# Patient Record
Sex: Female | Born: 1945 | Race: White | Hispanic: No | Smoking: Never smoker
Health system: Southern US, Community
[De-identification: ages and names within clinical notes are randomized; demographics above are authoritative.]

## PROBLEM LIST (undated history)

## (undated) DIAGNOSIS — I219 Acute myocardial infarction, unspecified: Secondary | ICD-10-CM

## (undated) DIAGNOSIS — E079 Disorder of thyroid, unspecified: Secondary | ICD-10-CM

## (undated) DIAGNOSIS — E119 Type 2 diabetes mellitus without complications: Secondary | ICD-10-CM

---

## 2018-01-30 ENCOUNTER — Other Ambulatory Visit (HOSPITAL_COMMUNITY): Payer: Self-pay | Admitting: Family Medicine

## 2018-01-30 DIAGNOSIS — Z1231 Encounter for screening mammogram for malignant neoplasm of breast: Secondary | ICD-10-CM

## 2018-04-07 ENCOUNTER — Other Ambulatory Visit: Payer: Self-pay

## 2018-04-07 ENCOUNTER — Inpatient Hospital Stay: Payer: Medicare Other

## 2018-04-07 ENCOUNTER — Encounter: Payer: Self-pay | Admitting: Emergency Medicine

## 2018-04-07 ENCOUNTER — Emergency Department: Payer: Medicare Other

## 2018-04-07 DIAGNOSIS — H9190 Unspecified hearing loss, unspecified ear: Secondary | ICD-10-CM | POA: Diagnosis present

## 2018-04-07 DIAGNOSIS — I5020 Unspecified systolic (congestive) heart failure: Secondary | ICD-10-CM | POA: Diagnosis present

## 2018-04-07 DIAGNOSIS — R531 Weakness: Secondary | ICD-10-CM

## 2018-04-07 DIAGNOSIS — I2510 Atherosclerotic heart disease of native coronary artery without angina pectoris: Secondary | ICD-10-CM | POA: Diagnosis present

## 2018-04-07 DIAGNOSIS — D65 Disseminated intravascular coagulation [defibrination syndrome]: Secondary | ICD-10-CM | POA: Diagnosis present

## 2018-04-07 DIAGNOSIS — Z7951 Long term (current) use of inhaled steroids: Secondary | ICD-10-CM

## 2018-04-07 DIAGNOSIS — Z8249 Family history of ischemic heart disease and other diseases of the circulatory system: Secondary | ICD-10-CM | POA: Diagnosis not present

## 2018-04-07 DIAGNOSIS — D649 Anemia, unspecified: Secondary | ICD-10-CM | POA: Diagnosis present

## 2018-04-07 DIAGNOSIS — E875 Hyperkalemia: Secondary | ICD-10-CM | POA: Diagnosis not present

## 2018-04-07 DIAGNOSIS — E1122 Type 2 diabetes mellitus with diabetic chronic kidney disease: Secondary | ICD-10-CM | POA: Diagnosis present

## 2018-04-07 DIAGNOSIS — E1165 Type 2 diabetes mellitus with hyperglycemia: Secondary | ICD-10-CM | POA: Diagnosis present

## 2018-04-07 DIAGNOSIS — I252 Old myocardial infarction: Secondary | ICD-10-CM

## 2018-04-07 DIAGNOSIS — Z20828 Contact with and (suspected) exposure to other viral communicable diseases: Secondary | ICD-10-CM

## 2018-04-07 DIAGNOSIS — R7989 Other specified abnormal findings of blood chemistry: Secondary | ICD-10-CM | POA: Diagnosis present

## 2018-04-07 DIAGNOSIS — I214 Non-ST elevation (NSTEMI) myocardial infarction: Principal | ICD-10-CM | POA: Diagnosis present

## 2018-04-07 DIAGNOSIS — E872 Acidosis: Secondary | ICD-10-CM | POA: Diagnosis present

## 2018-04-07 DIAGNOSIS — R011 Cardiac murmur, unspecified: Secondary | ICD-10-CM | POA: Diagnosis present

## 2018-04-07 DIAGNOSIS — D62 Acute posthemorrhagic anemia: Secondary | ICD-10-CM | POA: Diagnosis present

## 2018-04-07 DIAGNOSIS — I469 Cardiac arrest, cause unspecified: Secondary | ICD-10-CM | POA: Diagnosis not present

## 2018-04-07 DIAGNOSIS — I429 Cardiomyopathy, unspecified: Secondary | ICD-10-CM | POA: Diagnosis present

## 2018-04-07 DIAGNOSIS — J9601 Acute respiratory failure with hypoxia: Secondary | ICD-10-CM | POA: Diagnosis present

## 2018-04-07 DIAGNOSIS — G931 Anoxic brain damage, not elsewhere classified: Secondary | ICD-10-CM | POA: Diagnosis not present

## 2018-04-07 DIAGNOSIS — E876 Hypokalemia: Secondary | ICD-10-CM | POA: Diagnosis present

## 2018-04-07 DIAGNOSIS — N170 Acute kidney failure with tubular necrosis: Secondary | ICD-10-CM | POA: Diagnosis present

## 2018-04-07 DIAGNOSIS — E039 Hypothyroidism, unspecified: Secondary | ICD-10-CM | POA: Diagnosis present

## 2018-04-07 DIAGNOSIS — Z01818 Encounter for other preprocedural examination: Secondary | ICD-10-CM

## 2018-04-07 DIAGNOSIS — I959 Hypotension, unspecified: Secondary | ICD-10-CM | POA: Diagnosis not present

## 2018-04-07 DIAGNOSIS — N189 Chronic kidney disease, unspecified: Secondary | ICD-10-CM | POA: Diagnosis present

## 2018-04-07 DIAGNOSIS — Z91040 Latex allergy status: Secondary | ICD-10-CM

## 2018-04-07 DIAGNOSIS — R778 Other specified abnormalities of plasma proteins: Secondary | ICD-10-CM | POA: Diagnosis present

## 2018-04-07 DIAGNOSIS — M7989 Other specified soft tissue disorders: Secondary | ICD-10-CM

## 2018-04-07 HISTORY — DX: Disorder of thyroid, unspecified: E07.9

## 2018-04-07 HISTORY — DX: Acute myocardial infarction, unspecified: I21.9

## 2018-04-07 HISTORY — DX: Type 2 diabetes mellitus without complications: E11.9

## 2018-04-07 LAB — CBC WITH DIFFERENTIAL/PLATELET
Abs Immature Granulocytes: 1.68 10*3/uL — ABNORMAL HIGH (ref 0.00–0.07)
Basophils Absolute: 0.1 10*3/uL (ref 0.0–0.1)
Basophils Relative: 1 %
Eosinophils Absolute: 0.2 10*3/uL (ref 0.0–0.5)
Eosinophils Relative: 1 %
HCT: 27 % — ABNORMAL LOW (ref 36.0–46.0)
Hemoglobin: 8.2 g/dL — ABNORMAL LOW (ref 12.0–15.0)
Immature Granulocytes: 11 %
Lymphocytes Relative: 13 %
Lymphs Abs: 2.1 10*3/uL (ref 0.7–4.0)
MCH: 28.4 pg (ref 26.0–34.0)
MCHC: 30.4 g/dL (ref 30.0–36.0)
MCV: 93.4 fL (ref 80.0–100.0)
Monocytes Absolute: 0.8 10*3/uL (ref 0.1–1.0)
Monocytes Relative: 5 %
Neutro Abs: 10.9 10*3/uL — ABNORMAL HIGH (ref 1.7–7.7)
Neutrophils Relative %: 69 %
PLATELETS: 76 10*3/uL — AB (ref 150–400)
RBC: 2.89 MIL/uL — ABNORMAL LOW (ref 3.87–5.11)
RDW: 18.2 % — AB (ref 11.5–15.5)
WBC: 15.7 10*3/uL — ABNORMAL HIGH (ref 4.0–10.5)
nRBC: 1.6 % — ABNORMAL HIGH (ref 0.0–0.2)

## 2018-04-07 LAB — URINALYSIS, COMPLETE (UACMP) WITH MICROSCOPIC
BILIRUBIN URINE: NEGATIVE
Bacteria, UA: NONE SEEN
Glucose, UA: 50 mg/dL — AB
Hgb urine dipstick: NEGATIVE
Ketones, ur: NEGATIVE mg/dL
Leukocytes,Ua: NEGATIVE
Nitrite: NEGATIVE
Protein, ur: 30 mg/dL — AB
Specific Gravity, Urine: 1.023 (ref 1.005–1.030)
pH: 5 (ref 5.0–8.0)

## 2018-04-07 LAB — PROTIME-INR
INR: 1.3 — ABNORMAL HIGH (ref 0.8–1.2)
Prothrombin Time: 16.1 seconds — ABNORMAL HIGH (ref 11.4–15.2)

## 2018-04-07 LAB — HEPATIC FUNCTION PANEL
ALT: 13 U/L (ref 0–44)
AST: 27 U/L (ref 15–41)
Albumin: 2.1 g/dL — ABNORMAL LOW (ref 3.5–5.0)
Alkaline Phosphatase: 1000 U/L — ABNORMAL HIGH (ref 38–126)
BILIRUBIN DIRECT: 0.2 mg/dL (ref 0.0–0.2)
BILIRUBIN TOTAL: 0.6 mg/dL (ref 0.3–1.2)
Indirect Bilirubin: 0.4 mg/dL (ref 0.3–0.9)
Total Protein: 5.2 g/dL — ABNORMAL LOW (ref 6.5–8.1)

## 2018-04-07 LAB — CBC
HCT: 26.8 % — ABNORMAL LOW (ref 36.0–46.0)
Hemoglobin: 8.4 g/dL — ABNORMAL LOW (ref 12.0–15.0)
MCH: 27.5 pg (ref 26.0–34.0)
MCHC: 31.3 g/dL (ref 30.0–36.0)
MCV: 87.9 fL (ref 80.0–100.0)
PLATELETS: 85 10*3/uL — AB (ref 150–400)
RBC: 3.05 MIL/uL — ABNORMAL LOW (ref 3.87–5.11)
RDW: 17.8 % — ABNORMAL HIGH (ref 11.5–15.5)
WBC: 15.4 10*3/uL — ABNORMAL HIGH (ref 4.0–10.5)
nRBC: 1.5 % — ABNORMAL HIGH (ref 0.0–0.2)

## 2018-04-07 LAB — BASIC METABOLIC PANEL
Anion gap: 15 (ref 5–15)
BUN: 65 mg/dL — ABNORMAL HIGH (ref 8–23)
CALCIUM: 7.7 mg/dL — AB (ref 8.9–10.3)
CO2: 15 mmol/L — ABNORMAL LOW (ref 22–32)
Chloride: 105 mmol/L (ref 98–111)
Creatinine, Ser: 1.26 mg/dL — ABNORMAL HIGH (ref 0.44–1.00)
GFR calc Af Amer: 49 mL/min — ABNORMAL LOW (ref >60)
GFR, EST NON AFRICAN AMERICAN: 42 mL/min — AB (ref >60)
Glucose, Bld: 372 mg/dL — ABNORMAL HIGH (ref 70–99)
Potassium: 2.2 mmol/L — CL (ref 3.5–5.1)
Sodium: 135 mmol/L (ref 135–145)

## 2018-04-07 LAB — GLUCOSE, CAPILLARY: Glucose-Capillary: 372 mg/dL — ABNORMAL HIGH (ref 70–99)

## 2018-04-07 LAB — MAGNESIUM: Magnesium: 2.4 mg/dL (ref 1.7–2.4)

## 2018-04-07 LAB — TROPONIN I
Troponin I: 3.26 ng/mL (ref <0.03)
Troponin I: 2.91 ng/mL (ref <0.03)

## 2018-04-07 LAB — LACTIC ACID, PLASMA
LACTIC ACID, VENOUS: 2 mmol/L — AB (ref 0.5–1.9)
Lactic Acid, Venous: 2.9 mmol/L (ref 0.5–1.9)
Lactic Acid, Venous: 2.6 mmol/L (ref 0.5–1.9)

## 2018-04-07 LAB — HEPARIN LEVEL (UNFRACTIONATED): Heparin Unfractionated: <0.1 IU/mL — ABNORMAL LOW (ref 0.30–0.70)

## 2018-04-07 LAB — APTT: aPTT: 33 seconds (ref 24–36)

## 2018-04-07 MED ORDER — INSULIN ASPART 100 UNIT/ML ~~LOC~~ SOLN
0.0000 [IU] | Freq: Every day | SUBCUTANEOUS | Status: DC
  Administered 2018-04-07: 5 [IU] via SUBCUTANEOUS
  Filled 2018-04-07: qty 1

## 2018-04-07 MED ORDER — INSULIN ASPART 100 UNIT/ML ~~LOC~~ SOLN
0.0000 [IU] | Freq: Three times a day (TID) | SUBCUTANEOUS | Status: DC
  Administered 2018-04-08: 5 [IU] via SUBCUTANEOUS
  Administered 2018-04-08: 3 [IU] via SUBCUTANEOUS
  Filled 2018-04-07 (×2): qty 1

## 2018-04-07 MED ORDER — POTASSIUM CHLORIDE 10 MEQ/100ML IV SOLN
10.0000 meq | Freq: Once | INTRAVENOUS | Status: AC
  Administered 2018-04-07: 10 meq via INTRAVENOUS
  Filled 2018-04-07 (×2): qty 100

## 2018-04-07 MED ORDER — ONDANSETRON HCL 4 MG/2ML IJ SOLN: 4.0000 mg | Freq: Four times a day (QID) | INTRAMUSCULAR | Status: DC | PRN

## 2018-04-07 MED ORDER — CARVEDILOL 3.125 MG PO TABS
3.1250 mg | ORAL_TABLET | Freq: Two times a day (BID) | ORAL | Status: DC
  Administered 2018-04-07 – 2018-04-08 (×2): 3.125 mg via ORAL
  Filled 2018-04-07 (×2): qty 1

## 2018-04-07 MED ORDER — POTASSIUM CHLORIDE 10 MEQ/100ML IV SOLN
10.0000 meq | Freq: Once | INTRAVENOUS | Status: AC
  Administered 2018-04-07: 10 meq via INTRAVENOUS
  Filled 2018-04-07: qty 100

## 2018-04-07 MED ORDER — SODIUM CHLORIDE 0.9% FLUSH: 3.0000 mL | Freq: Once | INTRAVENOUS | Status: DC

## 2018-04-07 MED ORDER — POTASSIUM CHLORIDE CRYS ER 20 MEQ PO TBCR
40.0000 meq | EXTENDED_RELEASE_TABLET | Freq: Three times a day (TID) | ORAL | Status: AC
  Administered 2018-04-07 – 2018-04-08 (×3): 40 meq via ORAL
  Filled 2018-04-07 (×3): qty 2

## 2018-04-07 MED ORDER — SODIUM CHLORIDE 0.9 % IV BOLUS
1000.0000 mL | Freq: Once | INTRAVENOUS | Status: AC
  Administered 2018-04-07: 1000 mL via INTRAVENOUS

## 2018-04-07 MED ORDER — SODIUM CHLORIDE 0.9 % IV SOLN
INTRAVENOUS | Status: DC
  Administered 2018-04-07: 22:00:00 via INTRAVENOUS

## 2018-04-07 MED ORDER — ACETAMINOPHEN 325 MG PO TABS
650.0000 mg | ORAL_TABLET | Freq: Four times a day (QID) | ORAL | Status: DC | PRN
  Administered 2018-04-07: 650 mg via ORAL
  Filled 2018-04-07: qty 2

## 2018-04-07 MED ORDER — ASPIRIN EC 81 MG PO TBEC
81.0000 mg | DELAYED_RELEASE_TABLET | Freq: Every day | ORAL | Status: DC
  Filled 2018-04-07: qty 1

## 2018-04-07 MED ORDER — ACETAMINOPHEN 650 MG RE SUPP: 650.0000 mg | Freq: Four times a day (QID) | RECTAL | Status: DC | PRN

## 2018-04-07 MED ORDER — ONDANSETRON HCL 4 MG PO TABS: 4.0000 mg | ORAL_TABLET | Freq: Four times a day (QID) | ORAL | Status: DC | PRN

## 2018-04-07 MED ORDER — IOHEXOL 350 MG/ML SOLN
60.0000 mL | Freq: Once | INTRAVENOUS | Status: AC | PRN
  Administered 2018-04-07: 60 mL via INTRAVENOUS

## 2018-04-07 MED ORDER — POLYETHYLENE GLYCOL 3350 17 G PO PACK: 17.0000 g | PACK | Freq: Every day | ORAL | Status: DC | PRN

## 2018-04-07 MED ORDER — HEPARIN (PORCINE) 25000 UT/250ML-% IV SOLN
900.0000 [IU]/h | INTRAVENOUS | Status: DC
  Administered 2018-04-07: 700 [IU]/h via INTRAVENOUS
  Filled 2018-04-07: qty 250

## 2018-04-07 MED ORDER — ATORVASTATIN CALCIUM 20 MG PO TABS
40.0000 mg | ORAL_TABLET | Freq: Every day | ORAL | Status: DC
  Administered 2018-04-07: 40 mg via ORAL
  Filled 2018-04-07: qty 2

## 2018-04-07 NOTE — ED Notes (Signed)
patient c/o of nausea.  Admit orders released and PRN zofran administered. Vss. Heparin infusing Troponin now order drawn and sent.

## 2018-04-07 NOTE — Consult Note (Addendum)
ANTICOAGULATION CONSULT NOTE - Initial Consult  Pharmacy Consult for heparin  Indication: chest pain/ACS  Allergies  Allergen Reactions  . Latex Rash    Patient Measurements: Height: 5\' 3"  (160 cm) Weight: 130 lb (59 kg) IBW/kg (Calculated) : 52.4 Heparin Dosing Weight: 59 kg   Vital Signs: Temp: 98.6 F (37 C) (03/30 1522) Temp Source: Oral (03/30 1522) BP: 124/70 (03/30 1522) Pulse Rate: 94 (03/30 1522)  Labs: Recent Labs    03/31/2018 1157 04/05/2018 1417  HGB 8.4*  --   HCT 26.8*  --   PLT 85*  --   CREATININE 1.26*  --   TROPONINI  --  3.26*     Estimated Creatinine Clearance: 32.9 mL/min (A) (by C-G formula based on SCr of 1.26 mg/dL (H)).   Medical History: Past Medical History:  Diagnosis Date  . Diabetes mellitus without complication (HCC)   . MI (myocardial infarction) (HCC)   . Thyroid disease     Medications:  (Not in a hospital admission)  Scheduled:  . sodium chloride flush  3 mL Intravenous Once   Infusions:  . potassium chloride 10 mEq (03/31/2018 1525)   PRN:   Assessment: Pharmacy consulted for ACS; the tropinin level is elevated. However, her platelet count is indicative of thrombocytopenia. Therefore, a bolus dose was not given. Will need to assess platelet count moving forward.. Patient does not have a h/o anticoagulation per chart review. The medication history was not completed at the time, but home medication was confirmed with her son "Tasia Catchings" Chrissie Noa). He noted that he is not aware of his mother taking "blood thinners" but noted she does take a "baby aspirin" each day.   Goal of Therapy:  Heparin level 0.3-0.7 units/ml Monitor platelets by anticoagulation protocol: Yes   Plan:  Start heparin infusion at 700 units/hr Check anti-Xa level in 8 hours and daily while on heparin  Katha Cabal, PharmD 03/24/2018,3:27 PM

## 2018-04-07 NOTE — ED Notes (Signed)
Heparin drip started. PT / INR drawn and sent

## 2018-04-07 NOTE — Progress Notes (Signed)
Family Meeting Note  Advance Directive:no  Today a meeting took place with the Patient.  Patient is able to participate.  The following clinical team members were present during this meeting:MD  The following were discussed:Patient's diagnosis: elevated troponin, Patient's progosis: Unable to determine and Goals for treatment: Full Code  Additional follow-up to be provided: prn  Time spent during discussion:20 minutes  Hilton Sinclair, MD

## 2018-04-07 NOTE — ED Triage Notes (Signed)
Son says patient has had h3ealth problems for about 4 years and has seen various doctors. The last ging in [place].  Says for 3 weeks has weakness, pain, can barely move and is not eating or drinking enough.  Patient is awake now in wheelchair.

## 2018-04-07 NOTE — ED Notes (Signed)
ED TO INPATIENT HANDOFF REPORT  ED Nurse Name and Phone #:   S Name/Age/Gender Stacy Duran 73 y.o. female Room/Bed: ED08A/ED08A  Code Status   Code Status: Full Code  Home/SNF/Other Home Patient oriented to: self Is this baseline? Yes  yes Triage Complete: Triage complete  Chief Complaint multiple medical complaints  Triage Note Son says patient has had h3ealth problems for about 4 years and has seen various doctors. The last ging in [place].  Says for 3 weeks has weakness, pain, can barely move and is not eating or drinking enough.  Patient is awake now in wheelchair.     Allergies Allergies  Allergen Reactions  . Latex Rash    Level of Care/Admitting Diagnosis ED Disposition    ED Disposition Condition Comment   Admit  Hospital Area: [facility] [[id number]]  Level of Care: Telemetry [5]  Diagnosis: Elevated troponin [321909]  Admitting Physician: Willadean Carol DODD [[id number]]  Attending Physician: Willadean Carol DODD [[id number]]  Estimated length of stay: past midnight tomorrow  Certification:: I certify this patient will need inpatient services for at least 2 midnights  PT Class (Do Not Modify): Inpatient [101]  PT Acc Code (Do Not Modify): Private [1]       B Medical/Surgery History Past Medical History:  Diagnosis Date  . Diabetes mellitus without complication (HCC)   . MI (myocardial infarction) (HCC)   . Thyroid disease    History reviewed. No pertinent surgical history.   A IV Location/Drains/Wounds Patient Lines/Drains/Airways Status   Active Line/Drains/Airways    Name:   Placement date:   Placement time:   Site:   Days:   Peripheral IV 03/19/2018 Right Antecubital   04/03/2018    1307    Antecubital   less than 1   Peripheral IV 03/11/2018 Right Wrist   04/01/2018    1538    Wrist   less than 1          Intake/Output Last 24 hours  Intake/Output Summary (Last 24 hours) at 03/11/2018 1814 Last data filed at 03/20/2018 1704 Gross  per 24 hour  Intake 1200 ml  Output -  Net 1200 ml    Labs/Imaging Results for orders placed or performed during the hospital encounter of 04/06/2018 (from the past 48 hour(s))  Basic metabolic panel     Status: Abnormal   Collection Time: 03/09/2018 11:57 AM  Result Value Ref Range   Sodium 135 135 - 145 mmol/L   Potassium 2.2 (LL) 3.5 - 5.1 mmol/L    Comment: CRITICAL RESULT CALLED TO, READ BACK BY AND VERIFIED WITH LINDA MCLAMB @1229  03/15/2018 MU/DAS    Chloride 105 98 - 111 mmol/L   CO2 15 (L) 22 - 32 mmol/L   Glucose, Bld 372 (H) 70 - 99 mg/dL   BUN 65 (H) 8 - 23 mg/dL   Creatinine, Ser 5.83 (H) 0.44 - 1.00 mg/dL   Calcium 7.7 (L) 8.9 - 10.3 mg/dL   GFR calc non Af Amer 42 (L) >60 mL/min   GFR calc Af Amer 49 (L) >60 mL/min   Anion gap 15 5 - 15    Comment: Performed at [facility], [address] [place], [place] [address]  CBC     Status: Abnormal   Collection Time: 03/12/2018 11:57 AM  Result Value Ref Range   WBC 15.4 (H) 4.0 - 10.5 K/uL   RBC 3.05 (L) 3.87 - 5.11 MIL/uL   Hemoglobin 8.4 (L) 12.0 - 15.0 g/dL  HCT 26.8 (L) 36.0 - 46.0 %   MCV 87.9 80.0 - 100.0 fL   MCH 27.5 26.0 - 34.0 pg   MCHC 31.3 30.0 - 36.0 g/dL   RDW 29.2 (H) 44.6 - 28.6 %   Platelets 85 (L) 150 - 400 K/uL    Comment: Immature Platelet Fraction may be clinically indicated, consider ordering this additional test [id number]    nRBC 1.5 (H) 0.0 - 0.2 %    Comment: Performed at [facility], [address] [place], [place] [address]  Urinalysis, Complete w Microscopic     Status: Abnormal   Collection Time: 03/18/2018  1:02 PM  Result Value Ref Range   Color, Urine YELLOW (A) YELLOW   APPearance HAZY (A) CLEAR   Specific Gravity, Urine 1.023 1.005 - 1.030   pH 5.0 5.0 - 8.0   Glucose, UA 50 (A) NEGATIVE mg/dL   Hgb urine dipstick NEGATIVE NEGATIVE   Bilirubin Urine NEGATIVE NEGATIVE   Ketones, ur NEGATIVE NEGATIVE mg/dL   Protein, ur 30 (A) NEGATIVE mg/dL   Nitrite  NEGATIVE NEGATIVE   Leukocytes,Ua NEGATIVE NEGATIVE   RBC / HPF 0-5 0 - 5 RBC/hpf   WBC, UA 6-10 0 - 5 WBC/hpf   Bacteria, UA NONE SEEN NONE SEEN   Squamous Epithelial / LPF 0-5 0 - 5   Mucus PRESENT    Hyaline Casts, UA PRESENT     Comment: Performed at [facility], [address] [place], [place] [address]  Troponin I - Once     Status: Abnormal   Collection Time: 04/06/2018  2:17 PM  Result Value Ref Range   Troponin I 3.26 (HH) <0.03 ng/mL    Comment: CRITICAL RESULT CALLED TO, READ BACK BY AND VERIFIED WITH KIM GAULT AT 1450 04/02/2018 DAS Performed at [facility], [address] [place], [place] [address]   Lactic acid, plasma     Status: Abnormal   Collection Time: 04/04/2018  2:17 PM  Result Value Ref Range   Lactic Acid, Venous 2.0 (HH) 0.5 - 1.9 mmol/L    Comment: CRITICAL RESULT CALLED TO, READ BACK BY AND VERIFIED WITH Bueford Arp PEREZ AT 1533 03/22/2018 DAS Performed at [facility], [address], [place], [place] [address]   Magnesium     Status: None   Collection Time: 03/12/2018  2:17 PM  Result Value Ref Range   Magnesium 2.4 1.7 - 2.4 mg/dL    Comment: Performed at [facility], [address] [place], [place] [address]  Lactic acid, plasma     Status: Abnormal   Collection Time: 03/31/2018  3:41 PM  Result Value Ref Range   Lactic Acid, Venous 2.9 (HH) 0.5 - 1.9 mmol/L    Comment: CRITICAL RESULT CALLED TO, READ BACK BY AND VERIFIED WITH JANETTE PEREZ AT 1612 04/02/2018.  TFK Performed at [facility], [address] [place], [place] [address]   Protime-INR     Status: Abnormal   Collection Time: 03/09/2018  4:07 PM  Result Value Ref Range   Prothrombin Time 16.1 (H) 11.4 - 15.2 seconds   INR 1.3 (H) 0.8 - 1.2    Comment: (NOTE) INR goal varies based on device and disease states. Performed at [facility], [address] [place], [place] [address]   APTT     Status: None   Collection Time:  03/21/2018  4:07 PM  Result Value Ref Range   aPTT 33 24 - 36 seconds  Comment: Performed at [facility], [address] [place], [place] [address]   Dg Chest Portable 1 View  Result Date: 03/23/2018 CLINICAL DATA:  Three-week history weakness.  Hypoxia. EXAM: PORTABLE CHEST 1 VIEW COMPARISON:  None. FINDINGS: The heart is enlarged. There is no edema or effusion. The visualized soft tissues and bony thorax are unremarkable. IMPRESSION: Cardiomegaly without failure. Electronically Signed   By: Marin Robertshristopher  Mattern M.D.   On: 03/25/2018 14:37    Pending Labs Unresulted Labs (From admission, onward)    Start     Ordered   05/20/18 0500  CBC  Tomorrow morning,   STAT     03/23/2018 1551   05/20/18 0500  Basic metabolic panel  Tomorrow morning,   STAT     03/14/2018 1742   05/20/18 0500  Ferritin  Tomorrow morning,   STAT     03/28/2018 1742   05/20/18 0500  Iron and TIBC  Tomorrow morning,   STAT     04/04/2018 1742   05/20/18 0500  Reticulocytes  Tomorrow morning,   STAT     03/28/2018 1742   05/20/18 0500  Vitamin B12  Tomorrow morning,   STAT     03/15/2018 1742   05/20/18 0500  Folate  Tomorrow morning,   STAT     03/20/2018 1742   05/20/18 0500  Hemoglobin A1c  Tomorrow morning,   STAT     03/13/2018 1742   05/20/18 0500  Lipid panel  Tomorrow morning,   STAT     03/17/2018 1742   03/20/2018 2200  Heparin level (unfractionated)  Once-Timed,   STAT     03/27/2018 1551   03/25/2018 1841  Lactic acid, plasma  STAT Now then every 3 hours,   STAT     03/20/2018 1742   03/19/2018 1742  Troponin I - Now Then Q6H  Now then every 6 hours,   STAT     04/06/2018 1742   03/27/2018 1742  Respiratory Panel by PCR  (Respiratory virus panel with precautions)  Once,   STAT     03/29/2018 1742   03/28/2018 1742  Occult blood card to lab, stool RN will collect  Once,   STAT    Question:  Specimen to be collected by?  Answer:  RN will collect   03/23/2018 1742   03/26/2018 1742  Differential  Add-on,   AD     03/24/2018  1742   03/23/2018 1742  Hepatic function panel  Add-on,   AD     03/09/2018 1742          Vitals/Pain Today's Vitals   03/16/2018 1630 03/31/2018 1645 03/12/2018 1700 03/29/2018 1705  BP: 108/76  118/74   Pulse: 91 87 86   Resp: (!) 23 (!) 24 (!) 25   Temp:      TempSrc:      SpO2: 91% 91% 92%   Weight:      Height:      PainSc:    0-No pain    Isolation Precautions Droplet precaution  Medications Medications  sodium chloride flush (NS) 0.9 % injection 3 mL (3 mLs Intravenous Not Given 03/10/2018 1432)  heparin ADULT infusion 100 units/mL (25000 units/27450mL sodium chloride 0.45%) (700 Units/hr Intravenous New Bag/Given 03/15/2018 1703)  0.9 %  sodium chloride infusion (has no administration in time range)  acetaminophen (TYLENOL) tablet 650 mg (has no administration in time range)    Or  acetaminophen (TYLENOL) suppository 650 mg (has no administration  in time range)  polyethylene glycol (MIRALAX / GLYCOLAX) packet 17 g (has no administration in time range)  ondansetron (ZOFRAN) tablet 4 mg (has no administration in time range)    Or  ondansetron (ZOFRAN) injection 4 mg (has no administration in time range)  aspirin EC tablet 81 mg (has no administration in time range)  potassium chloride SA (K-DUR,KLOR-CON) CR tablet 40 mEq (has no administration in time range)  insulin aspart (novoLOG) injection 0-9 Units (has no administration in time range)  insulin aspart (novoLOG) injection 0-5 Units (has no administration in time range)  carvedilol (COREG) tablet 3.125 mg (has no administration in time range)  atorvastatin (LIPITOR) tablet 40 mg (has no administration in time range)  sodium chloride 0.9 % bolus 1,000 mL (0 mLs Intravenous Stopped 01-May-2018 1704)  potassium chloride 10 mEq in 100 mL IVPB (0 mEq Intravenous Stopped 05/01/2018 1524)  potassium chloride 10 mEq in 100 mL IVPB (0 mEq Intravenous Stopped 01-May-2018 1658)  iohexol (OMNIPAQUE) 350 MG/ML injection 60 mL (60 mLs Intravenous Contrast  Given 01-May-2018 1755)    Mobility walks with device High fall risk   Focused Assessments Cardiac Assessment Handoff:    Lab Results  Component Value Date   TROPONINI 3.26 (HH) 01-May-2018   No results found for: DDIMER Does the Patient currently have chest pain? No     R Recommendations: See Admitting Provider Note  Report given to:   Additional Notes:

## 2018-04-07 NOTE — ED Provider Notes (Signed)
[facility] Emergency Department Provider Note ____________________________________________   First MD Initiated Contact with Patient 2018/05/03 1330     (approximate)  I have reviewed the triage vital signs and the nursing notes.   HISTORY  Chief Complaint Weakness and Anorexia    HPI Stacy Duran is a 73 y.o. female with PMH as noted below who presents with generalized weakness, gradual onset over the last several weeks, acutely worse in the last 1 to 2 days, and associate with decreased appetite.  She denies fever, vomiting or diarrhea, or specific pain although she does have some chest discomfort.  She denies any sick contacts or recent illness.  Past Medical History:  Diagnosis Date  . Diabetes mellitus without complication (HCC)   . MI (myocardial infarction) (HCC)   . Thyroid disease     There are no active problems to display for this patient.   History reviewed. No pertinent surgical history.  Prior to Admission medications   Medication Sig Start Date End Date Taking? Authorizing Provider  fluticasone (FLONASE) 50 MCG/ACT nasal spray Place 1 spray into both nostrils 2 (two) times daily. 03/11/18  Yes [provider]    Allergies Latex  No family history on file.  Social History Social History   Tobacco Use  . Smoking status: Never Smoker  . Smokeless tobacco: Never Used  Substance Use Topics  . Alcohol use: Never    Frequency: Never  . Drug use: Not on file    Review of Systems  Constitutional: No fever.  Positive for generalized weakness. Eyes: No redness. ENT: No sore throat. Cardiovascular: Denies chest pain. Respiratory: Positive for mild shortness of breath. Gastrointestinal: No vomiting or diarrhea.  Genitourinary: Negative for dysuria.  Musculoskeletal: Negative for back pain. Skin: Negative for rash. Neurological: Negative for headaches, focal weakness or numbness.    ____________________________________________   PHYSICAL EXAM:  VITAL SIGNS: ED Triage Vitals  Enc Vitals Group     BP 2018/05/03 1150 (!) 94/52     Pulse Rate 03-May-2018 1150 86     Resp 05-03-2018 1150 20     Temp 05/03/2018 1150 97.6 F (36.4 C)     Temp Source 03-May-2018 1150 Axillary     SpO2 05/03/18 1150 94 %     Weight 03-May-2018 1151 130 lb (59 kg)     Height 05/03/2018 1151 5\' 3"  (1.6 m)     Head Circumference --      Peak Flow --      Pain Score 05/03/18 1151 3     Pain Loc --      Pain Edu? --      Excl. in GC? --     Constitutional: Alert and oriented.  Weak and frail appearing but in no acute distress. Eyes: Conjunctivae are normal.  EOMI.  PERRLA. Head: Atraumatic. Nose: No congestion/rhinnorhea. Mouth/Throat: Mucous membranes are dry.   Neck: Normal range of motion.  Cardiovascular: Normal rate, regular rhythm. Grossly normal heart sounds.  Good peripheral circulation. Respiratory: Normal respiratory effort.  No retractions. Lungs CTAB. Gastrointestinal: Soft and nontender. No distention.  Genitourinary: No flank tenderness. Musculoskeletal: No lower extremity edema.  Extremities warm and well perfused.  Neurologic:  Normal speech and language.  5/5 motor strength and intact sensation to all extremities.  No gross focal neurologic deficits are appreciated.  Skin:  Skin is warm and dry. No rash noted. Psychiatric: Mood and affect are normal. Speech and behavior are normal.  ____________________________________________   LABS (all  labs ordered are listed, but only abnormal results are displayed)  Labs Reviewed  BASIC METABOLIC PANEL - Abnormal; Notable for the following components:      Result Value   Potassium 2.2 (*)    CO2 15 (*)    Glucose, Bld 372 (*)    BUN 65 (*)    Creatinine, Ser 1.26 (*)    Calcium 7.7 (*)    GFR calc non Af Amer 42 (*)    GFR calc Af Amer 49 (*)    All other components within normal limits  CBC - Abnormal; Notable for the following  components:   WBC 15.4 (*)    RBC 3.05 (*)    Hemoglobin 8.4 (*)    HCT 26.8 (*)    RDW 17.8 (*)    Platelets 85 (*)    nRBC 1.5 (*)    All other components within normal limits  URINALYSIS, COMPLETE (UACMP) WITH MICROSCOPIC - Abnormal; Notable for the following components:   Color, Urine YELLOW (*)    APPearance HAZY (*)    Glucose, UA 50 (*)    Protein, ur 30 (*)    All other components within normal limits  TROPONIN I - Abnormal; Notable for the following components:   Troponin I 3.26 (*)    All other components within normal limits  LACTIC ACID, PLASMA - Abnormal; Notable for the following components:   Lactic Acid, Venous 2.0 (*)    All other components within normal limits  MAGNESIUM  LACTIC ACID, PLASMA  PROTIME-INR  APTT  HEPARIN LEVEL (UNFRACTIONATED)   ____________________________________________  EKG  ED ECG REPORT I, Dionne Bucy, the attending physician, personally viewed and interpreted this ECG.  Date: 04/25/18 EKG Time: 1153 Rate: 90 Rhythm: normal sinus rhythm QRS Axis: Left axis  Intervals: normal ST/T Wave abnormalities: normal Narrative Interpretation: no evidence of acute ischemia ____________________________________________  RADIOLOGY  CXR: Cardiomegaly with no focal infiltrate ____________________________________________   PROCEDURES  Procedure(s) performed: No  Procedures  Critical Care performed: Yes  CRITICAL CARE Performed by: Dionne Bucy   Total critical care time: 30 minutes  Critical care time was exclusive of separately billable procedures and treating other patients.  Critical care was necessary to treat or prevent imminent or life-threatening deterioration.  Critical care was time spent personally by me on the following activities: development of treatment plan with patient and/or surrogate as well as nursing, discussions with consultants, evaluation of patient's response to treatment, examination of  patient, obtaining history from patient or surrogate, ordering and performing treatments and interventions, ordering and review of laboratory studies, ordering and review of radiographic studies, pulse oximetry and re-evaluation of patient's condition. ____________________________________________   INITIAL IMPRESSION / ASSESSMENT AND PLAN / ED COURSE  Pertinent labs & imaging results that were available during my care of the patient were reviewed by me and considered in my medical decision making (see chart for details).  73 year old female with PMH as noted above presents with generalized weakness over the last few weeks, acutely worsened in the last few days, and associated with decreased appetite.  On exam the patient has a slightly low blood pressure.  Her O2 saturation was 89 to 90% on room air when I first entered the room but went up to the mid 90s when the patient started speaking.  Her other vital signs are normal.  She appears very weak and frail.  Neuro exam is nonfocal.  She has dry mucous membranes.  The remainder of the exam is  as described above.  Initial labs obtained from triage reveal hypokalemia as well as elevated WBC count.  Differential includes dehydration/hypovolemia, symptoms related to hypokalemia or other electrolyte abnormalities, infectious cause, or cardiac.  We will obtain chest x-ray, UA, add on a troponin and lactic acid, give a fluid bolus and potassium and reassess.  Given the patient's episodes of hypoxia and the low potassium as well as her very weak and debilitated state, she likely will require admission.  ----------------------------------------- 4:04 PM on 03/09/2018 -----------------------------------------  The work-up reveals elevated troponin although the patient has no EKG changes or significant ischemic findings.  I started her on a heparin drip, with no bolus because of her low platelets.  The patient will need to be admitted.  I signed her out to  the hospitalist Dr. Nancy MarusMayo.  ____________________________________________   FINAL CLINICAL IMPRESSION(S) / ED DIAGNOSES  Final diagnoses:  Non-ST elevated myocardial infarction (HCC)  Weakness  Hypokalemia      NEW MEDICATIONS STARTED DURING THIS VISIT:  New Prescriptions   No medications on file     Note:  This document was prepared using Dragon voice recognition software and may include unintentional dictation errors.   Dionne BucySiadecki, Brittnae Aschenbrenner, MD 04/02/2018 [phone]

## 2018-04-07 NOTE — ED Notes (Signed)
Flu swab sent. W/C with patient clothing sent up with ED tech to room 256.

## 2018-04-07 NOTE — ED Notes (Signed)
Patient off unit yo CT

## 2018-04-07 NOTE — ED Notes (Signed)
Assumed care of patient alert and awake soft spoken and HOH. Patient appears pale with pale conjunctivas and pale lips. Patient tachypnic, but denies sob, sating 94-95% on ra. Will monitor.

## 2018-04-07 NOTE — ED Notes (Signed)
Second IV placed to right wrist. Awaiting heparin wt based from pharmacy. Vss. Denies pain or discomforts. Bed status pending. Scheduled lactate drawn ans sent.

## 2018-04-07 NOTE — H&P (Addendum)
[employer] - [place] at [facility]   PATIENT NAME: Stacy Duran    MR#:  [id number]  DATE OF BIRTH:  09-Nov-1945  DATE OF ADMISSION:  04/02/2018  PRIMARY CARE PHYSICIAN: No primary care provider on file.   REQUESTING/REFERRING PHYSICIAN: Dionne Bucy, MD  CHIEF COMPLAINT:   Chief Complaint  Patient presents with  . Weakness  . Anorexia    HISTORY OF PRESENT ILLNESS:  Stacy Duran  is a 73 y.o. female with a known history of CAD s/p MI, type 2 diabetes, hypothyroidism who presented to the ED with generalized weakness over the last couple of days.  She also notes decreased appetite and poor p.o. intake.  The weakness is mostly located in her lower extremities.  She also endorses some body aches.  She states she has been spending more time in bed due to the weakness.  She denies any chest pain or palpitations.  She endorses some mild shortness of breath that is worse with exertion.  In the ED, she was mildly tachypneic.  Labs are significant for potassium 2.2, creatinine 1.26, WBC 15.4, hemoglobin 8.4, lactic acid 2.0, troponin 3.26.  UA was unremarkable.  Chest x-ray showed cardiomegaly without any acute pulmonary abnormalities.  She was started on a heparin drip.  Hospitalists were called for admission.  PAST MEDICAL HISTORY:   Past Medical History:  Diagnosis Date  . Diabetes mellitus without complication (HCC)   . MI (myocardial infarction) (HCC)   . Thyroid disease     PAST SURGICAL HISTORY:  None  SOCIAL HISTORY:   Social History   Tobacco Use  . Smoking status: Never Smoker  . Smokeless tobacco: Never Used  Substance Use Topics  . Alcohol use: Never    Frequency: Never    FAMILY HISTORY:  Father-heart attack  DRUG ALLERGIES:   Allergies  Allergen Reactions  . Latex Rash    REVIEW OF SYSTEMS:   Review of Systems  Constitutional: Positive for malaise/fatigue. Negative for chills and fever.  HENT: Negative for congestion  and sore throat.   Eyes: Negative for blurred vision and double vision.  Respiratory: Positive for shortness of breath. Negative for cough.   Cardiovascular: Positive for leg swelling. Negative for chest pain and palpitations.  Gastrointestinal: Negative for nausea and vomiting.  Genitourinary: Negative for dysuria and urgency.  Musculoskeletal: Positive for myalgias. Negative for back pain and neck pain.  Neurological: Positive for weakness. Negative for dizziness, sensory change, speech change, focal weakness and headaches.  Psychiatric/Behavioral: Negative for depression. The patient is not nervous/anxious.     MEDICATIONS AT HOME:   Prior to Admission medications   Medication Sig Start Date End Date Taking? Authorizing Provider  fluticasone (FLONASE) 50 MCG/ACT nasal spray Place 1 spray into both nostrils 2 (two) times daily. 03/11/18  Yes [provider]      VITAL SIGNS:  Blood pressure 124/70, pulse 94, temperature 98.6 F (37 C), temperature source Oral, resp. rate (!) 22, height 5\' 3"  (1.6 m), weight 59 kg, SpO2 95 %.  PHYSICAL EXAMINATION:  Physical Exam  GENERAL:  73 y.o.-year-old patient lying in the bed with no acute distress.  EYES: Pupils equal, round, reactive to light and accommodation. No scleral icterus. Extraocular muscles intact.  HEENT: Head atraumatic, normocephalic. Oropharynx and nasopharynx clear.  NECK:  Supple, no jugular venous distention. No thyroid enlargement, no tenderness.  LUNGS: Normal breath sounds bilaterally, no wheezing, rales,rhonchi or crepitation. + Mildly increased work of breathing. CARDIOVASCULAR: RRR, S1,  S2 normal. No murmurs, rubs, or gallops.  ABDOMEN: Soft, nontender, nondistended. Bowel sounds present. No organomegaly or mass.  EXTREMITIES: No pedal edema, cyanosis, or clubbing.  NEUROLOGIC: Cranial nerves II through XII are intact. + Global weakness. Sensation intact. Gait not checked.  PSYCHIATRIC: The patient is alert  and oriented x 3.  SKIN: No obvious rash, lesion, or ulcer. + Pale-appearing  LABORATORY PANEL:   CBC Recent Labs  Lab May 07, 2018 1157  WBC 15.4*  HGB 8.4*  HCT 26.8*  PLT 85*   ------------------------------------------------------------------------------------------------------------------  Chemistries  Recent Labs  Lab May 07, 2018 1157 May 07, 2018 1417  NA 135  --   K 2.2*  --   CL 105  --   CO2 15*  --   GLUCOSE 372*  --   BUN 65*  --   CREATININE 1.26*  --   CALCIUM 7.7*  --   MG  --  2.4   ------------------------------------------------------------------------------------------------------------------  Cardiac Enzymes Recent Labs  Lab 2018/05/07 1417  TROPONINI 3.26*   ------------------------------------------------------------------------------------------------------------------  RADIOLOGY:  Dg Chest Portable 1 View  Result Date: 05/07/2018 CLINICAL DATA:  Three-week history weakness.  Hypoxia. EXAM: PORTABLE CHEST 1 VIEW COMPARISON:  None. FINDINGS: The heart is enlarged. There is no edema or effusion. The visualized soft tissues and bony thorax are unremarkable. IMPRESSION: Cardiomegaly without failure. Electronically Signed   By: Marin Roberts M.D.   On: 05-07-2018 14:37      IMPRESSION AND PLAN:   Elevated troponin- due to NSTEMI vs demand ischemia. Troponin 3.26.  EKG without any signs of ischemia.  Patient does have a history of an MI.  She denies any active chest pain. -Continue heparin drip -[facility] consult -Trend troponins -Check ECHO -Start aspirin, beta blocker, lipitor -Check hemoglobin A1c and lipid panel -Cardiac monitoring  Shortness of breath- unclear etiology. O2 sats in the low 90s. No history of lung disease. CXR negative. Concern for DVT/PE with LLE edema and patient staying in the bed for the last couple of days.  No recent travel, mostly stays in the home and no fevers or cough, so low suspicion for COVID. -Check left  lower extremity doppler ultrasound -CTA chest for further evaluation -RVP -Check differential and LFTs -Continuous pulse ox  Lactic acidosis- unclear etiology.  She does have a leukocytosis, but is not meeting sepsis criteria on admission and does not have any signs of an active infection.  UA and chest x-ray are negative for infection. -IV fluids -Trend lactic acid -CTA chest for further evaluation  Hypokalemia- K 2.2.  Magnesium normal. -Replete and recheck  AKI versus CKD- creatinine 1.26.  Unknown baseline. -IV fluids -Avoid nephrotoxic agents -Recheck creatinine in the morning  Hyperglycemia-blood sugars in the 300s on admission.  No history of diabetes. -Check A1c -SSI  Normocytic anemia- hemoglobin 8.4.  No baseline labs for comparison.  Denies any active bleeding. -Check anemia panel and FOBT -Monitor hemoglobin  All the records are reviewed and case discussed with ED provider. Management plans discussed with the patient, family and they are in agreement.  CODE STATUS: Full  TOTAL TIME TAKING CARE OF THIS PATIENT: 45 minutes.    Jinny Blossom Mayo M.D on May 07, 2018 at 4:21 PM  Between 7am to 6pm - Pager [phone]  After 6pm go to [email or web address] - Scientist, research (life sciences) [place] [facility]  Office  [phone]  CC: Primary care physician; No primary care provider on file.   Note: This dictation was prepared with Dragon dictation along with  smaller phrase technology. Any transcriptional errors that result from this process are unintentional. 

## 2018-04-08 ENCOUNTER — Inpatient Hospital Stay
Admit: 2018-04-08 | Discharge: 2018-04-08 | Disposition: A | Discharge status: Home / Self Care | Payer: Medicare Other | Attending: Internal Medicine | Admitting: Internal Medicine

## 2018-04-08 ENCOUNTER — Inpatient Hospital Stay: Payer: Medicare Other

## 2018-04-08 LAB — BASIC METABOLIC PANEL
ANION GAP: 14 (ref 5–15)
Anion gap: 15 (ref 5–15)
BUN: 61 mg/dL — ABNORMAL HIGH (ref 8–23)
BUN: 58 mg/dL — ABNORMAL HIGH (ref 8–23)
CO2: 14 mmol/L — ABNORMAL LOW (ref 22–32)
CO2: 12 mmol/L — ABNORMAL LOW (ref 22–32)
Calcium: 7.5 mg/dL — ABNORMAL LOW (ref 8.9–10.3)
Calcium: 7 mg/dL — ABNORMAL LOW (ref 8.9–10.3)
Chloride: 109 mmol/L (ref 98–111)
Chloride: 108 mmol/L (ref 98–111)
Creatinine, Ser: 0.98 mg/dL (ref 0.44–1.00)
Creatinine, Ser: 1.41 mg/dL — ABNORMAL HIGH (ref 0.44–1.00)
GFR calc Af Amer: >60 mL/min (ref >60)
GFR calc Af Amer: 43 mL/min — ABNORMAL LOW (ref >60)
GFR calc non Af Amer: 57 mL/min — ABNORMAL LOW (ref >60)
GFR, EST NON AFRICAN AMERICAN: 37 mL/min — AB (ref >60)
Glucose, Bld: 299 mg/dL — ABNORMAL HIGH (ref 70–99)
Glucose, Bld: 518 mg/dL (ref 70–99)
Potassium: 2.6 mmol/L — CL (ref 3.5–5.1)
Potassium: 6.7 mmol/L (ref 3.5–5.1)
Sodium: 138 mmol/L (ref 135–145)
Sodium: 134 mmol/L — ABNORMAL LOW (ref 135–145)

## 2018-04-08 LAB — BLOOD GAS, ARTERIAL
Acid-base deficit: 23 mmol/L — ABNORMAL HIGH (ref 0.0–2.0)
Bicarbonate: 9.2 mmol/L — ABNORMAL LOW (ref 20.0–28.0)
FIO2: 1
MECHVT: 450 mL
O2 SAT: 99.9 %
PATIENT TEMPERATURE: 37
PEEP: 14 cmH2O
RATE: 14 resp/min
pCO2 arterial: 44 mmHg (ref 32.0–48.0)
pH, Arterial: 6.93 — CL (ref 7.350–7.450)
pO2, Arterial: 386 mmHg — ABNORMAL HIGH (ref 83.0–108.0)

## 2018-04-08 LAB — CBC
HCT: 22.1 % — ABNORMAL LOW (ref 36.0–46.0)
HCT: 30.1 % — ABNORMAL LOW (ref 36.0–46.0)
Hemoglobin: 6.9 g/dL — ABNORMAL LOW (ref 12.0–15.0)
Hemoglobin: 8.6 g/dL — ABNORMAL LOW (ref 12.0–15.0)
MCH: 27.6 pg (ref 26.0–34.0)
MCH: 27.9 pg (ref 26.0–34.0)
MCHC: 31.2 g/dL (ref 30.0–36.0)
MCHC: 28.6 g/dL — ABNORMAL LOW (ref 30.0–36.0)
MCV: 88.4 fL (ref 80.0–100.0)
MCV: 97.7 fL (ref 80.0–100.0)
Platelets: 68 10*3/uL — ABNORMAL LOW (ref 150–400)
Platelets: 84 10*3/uL — ABNORMAL LOW (ref 150–400)
RBC: 2.5 MIL/uL — ABNORMAL LOW (ref 3.87–5.11)
RBC: 3.08 MIL/uL — AB (ref 3.87–5.11)
RDW: 17.5 % — AB (ref 11.5–15.5)
RDW: 17.9 % — ABNORMAL HIGH (ref 11.5–15.5)
WBC: 13.4 10*3/uL — ABNORMAL HIGH (ref 4.0–10.5)
WBC: 33.3 10*3/uL — ABNORMAL HIGH (ref 4.0–10.5)
nRBC: 1.7 % — ABNORMAL HIGH (ref 0.0–0.2)
nRBC: 4.2 % — ABNORMAL HIGH (ref 0.0–0.2)

## 2018-04-08 LAB — GLUCOSE, CAPILLARY
GLUCOSE-CAPILLARY: 240 mg/dL — AB (ref 70–99)
Glucose-Capillary: 255 mg/dL — ABNORMAL HIGH (ref 70–99)
Glucose-Capillary: 275 mg/dL — ABNORMAL HIGH (ref 70–99)

## 2018-04-08 LAB — HEMOGLOBIN A1C
Hgb A1c MFr Bld: 7.7 % — ABNORMAL HIGH (ref 4.8–5.6)
Mean Plasma Glucose: 174.29 mg/dL

## 2018-04-08 LAB — HEPATIC FUNCTION PANEL
ALT: 32 U/L (ref 0–44)
AST: 108 U/L — ABNORMAL HIGH (ref 15–41)
Albumin: 1.6 g/dL — ABNORMAL LOW (ref 3.5–5.0)
Alkaline Phosphatase: 891 U/L — ABNORMAL HIGH (ref 38–126)
BILIRUBIN INDIRECT: 0.3 mg/dL (ref 0.3–0.9)
Bilirubin, Direct: 0.4 mg/dL — ABNORMAL HIGH (ref 0.0–0.2)
Total Bilirubin: 0.7 mg/dL (ref 0.3–1.2)
Total Protein: 4.3 g/dL — ABNORMAL LOW (ref 6.5–8.1)

## 2018-04-08 LAB — LIPID PANEL
CHOLESTEROL: 138 mg/dL (ref 0–200)
HDL: 23 mg/dL — ABNORMAL LOW (ref >40)
LDL Cholesterol: 61 mg/dL (ref 0–99)
Total CHOL/HDL Ratio: 6 RATIO
Triglycerides: 269 mg/dL — ABNORMAL HIGH (ref <150)
VLDL: 54 mg/dL — ABNORMAL HIGH (ref 0–40)

## 2018-04-08 LAB — RESPIRATORY PANEL BY PCR
Adenovirus: NOT DETECTED
Bordetella pertussis: NOT DETECTED
CORONAVIRUS OC43-RVPPCR: NOT DETECTED
Chlamydophila pneumoniae: NOT DETECTED
Coronavirus 229E: NOT DETECTED
Coronavirus HKU1: NOT DETECTED
Coronavirus NL63: NOT DETECTED
INFLUENZA B-RVPPCR: NOT DETECTED
Influenza A: NOT DETECTED
Metapneumovirus: NOT DETECTED
Mycoplasma pneumoniae: NOT DETECTED
Parainfluenza Virus 1: NOT DETECTED
Parainfluenza Virus 2: NOT DETECTED
Parainfluenza Virus 3: NOT DETECTED
Parainfluenza Virus 4: NOT DETECTED
Respiratory Syncytial Virus: NOT DETECTED
Rhinovirus / Enterovirus: NOT DETECTED

## 2018-04-08 LAB — FIBRIN DERIVATIVES D-DIMER (ARMC ONLY): Fibrin derivatives D-dimer (ARMC): >7500 ng/mL (FEU) — ABNORMAL HIGH (ref 0.00–499.00)

## 2018-04-08 LAB — PATHOLOGIST SMEAR REVIEW

## 2018-04-08 LAB — RETICULOCYTES
Immature Retic Fract: 39.6 % — ABNORMAL HIGH (ref 2.3–15.9)
RBC.: 2.5 MIL/uL — ABNORMAL LOW (ref 3.87–5.11)
Retic Count, Absolute: 51.8 10*3/uL (ref 19.0–186.0)
Retic Ct Pct: 2.1 % (ref 0.4–3.1)

## 2018-04-08 LAB — HEMOGLOBIN: Hemoglobin: 8.7 g/dL — ABNORMAL LOW (ref 12.0–15.0)

## 2018-04-08 LAB — LACTATE DEHYDROGENASE: LDH: 1293 U/L — ABNORMAL HIGH (ref 98–192)

## 2018-04-08 LAB — IRON AND TIBC
Iron: 85 ug/dL (ref 28–170)
Saturation Ratios: 40 % — ABNORMAL HIGH (ref 10.4–31.8)
TIBC: 210 ug/dL — ABNORMAL LOW (ref 250–450)
UIBC: 125 ug/dL

## 2018-04-08 LAB — ECHOCARDIOGRAM COMPLETE
Height: 63 in
Weight: 2080 oz

## 2018-04-08 LAB — FERRITIN: Ferritin: 3041 ng/mL — ABNORMAL HIGH (ref 11–307)

## 2018-04-08 LAB — PREPARE RBC (CROSSMATCH)

## 2018-04-08 LAB — ABO/RH: ABO/RH(D): O POS

## 2018-04-08 LAB — FOLATE: Folate: 7.1 ng/mL (ref >5.9)

## 2018-04-08 LAB — TROPONIN I
Troponin I: 3.7 ng/mL (ref <0.03)
Troponin I: 3.74 ng/mL (ref <0.03)

## 2018-04-08 LAB — MRSA PCR SCREENING: MRSA by PCR: NEGATIVE

## 2018-04-08 LAB — PROTIME-INR
INR: 2.1 — ABNORMAL HIGH (ref 0.8–1.2)
Prothrombin Time: 22.9 seconds — ABNORMAL HIGH (ref 11.4–15.2)

## 2018-04-08 LAB — APTT: aPTT: 58 seconds — ABNORMAL HIGH (ref 24–36)

## 2018-04-08 LAB — MAGNESIUM: Magnesium: 2.2 mg/dL (ref 1.7–2.4)

## 2018-04-08 LAB — PHOSPHORUS: Phosphorus: 7.1 mg/dL — ABNORMAL HIGH (ref 2.5–4.6)

## 2018-04-08 LAB — VITAMIN B12: Vitamin B-12: 6426 pg/mL — ABNORMAL HIGH (ref 180–914)

## 2018-04-08 LAB — HEPARIN LEVEL (UNFRACTIONATED): Heparin Unfractionated: 0.15 IU/mL — ABNORMAL LOW (ref 0.30–0.70)

## 2018-04-08 MED ORDER — STERILE WATER FOR INJECTION IV SOLN
INTRAVENOUS | Status: DC
  Administered 2018-04-08: 18:00:00 via INTRAVENOUS
  Filled 2018-04-08 (×3): qty 850

## 2018-04-08 MED ORDER — ASPIRIN 81 MG PO CHEW: 81.0000 mg | CHEWABLE_TABLET | Freq: Every day | ORAL | Status: DC

## 2018-04-08 MED ORDER — SODIUM CHLORIDE 0.9 % WEIGHT BASED INFUSION: 3.0000 mL/kg/h | INTRAVENOUS | Status: DC

## 2018-04-08 MED ORDER — SODIUM CHLORIDE 0.9 % WEIGHT BASED INFUSION: 1.0000 mL/kg/h | INTRAVENOUS | Status: DC

## 2018-04-08 MED ORDER — NOREPINEPHRINE 4 MG/250ML-% IV SOLN
0.0000 ug/min | INTRAVENOUS | Status: DC
  Administered 2018-04-08: 20 ug/min via INTRAVENOUS
  Filled 2018-04-08 (×3): qty 250

## 2018-04-08 MED ORDER — POTASSIUM CHLORIDE 10 MEQ/100ML IV SOLN
10.0000 meq | INTRAVENOUS | Status: AC
  Administered 2018-04-08 (×4): 10 meq via INTRAVENOUS
  Filled 2018-04-08 (×8): qty 100

## 2018-04-08 MED ORDER — ADULT MULTIVITAMIN W/MINERALS CH: 1.0000 | ORAL_TABLET | Freq: Every day | ORAL | Status: DC

## 2018-04-08 MED ORDER — SODIUM CHLORIDE 0.9 % IV SOLN: 250.0000 mL | INTRAVENOUS | Status: DC | PRN

## 2018-04-08 MED ORDER — SODIUM CHLORIDE 0.9 % IV SOLN
2.0000 g | Freq: Two times a day (BID) | INTRAVENOUS | Status: DC
  Filled 2018-04-08 (×2): qty 2

## 2018-04-08 MED ORDER — ASPIRIN 81 MG PO CHEW: 81.0000 mg | CHEWABLE_TABLET | ORAL | Status: DC

## 2018-04-08 MED ORDER — NEPRO/CARBSTEADY PO LIQD: 237.0000 mL | Freq: Two times a day (BID) | ORAL | Status: DC

## 2018-04-08 MED ORDER — TRAMADOL HCL 50 MG PO TABS
50.0000 mg | ORAL_TABLET | Freq: Three times a day (TID) | ORAL | Status: DC | PRN
  Administered 2018-04-08 (×2): 50 mg via ORAL
  Filled 2018-04-08 (×2): qty 1

## 2018-04-08 MED ORDER — SODIUM CHLORIDE 0.9% IV SOLUTION: Freq: Once | INTRAVENOUS | Status: DC

## 2018-04-08 MED ORDER — SODIUM CHLORIDE 0.9 % IV SOLN
500.0000 mg | INTRAVENOUS | Status: DC
  Filled 2018-04-08: qty 500

## 2018-04-08 MED ORDER — SODIUM BICARBONATE 8.4 % IV SOLN: 50.0000 meq | Freq: Once | INTRAVENOUS | Status: DC

## 2018-04-08 MED ORDER — VANCOMYCIN HCL 10 G IV SOLR
1500.0000 mg | Freq: Once | INTRAVENOUS | Status: AC
  Administered 2018-04-08: 1500 mg via INTRAVENOUS
  Filled 2018-04-08: qty 1500

## 2018-04-08 MED ORDER — ROCURONIUM BROMIDE 50 MG/5ML IV SOLN
30.0000 mg | Freq: Once | INTRAVENOUS | Status: AC
  Administered 2018-04-08: 30 mg via INTRAVENOUS

## 2018-04-08 MED ORDER — VANCOMYCIN HCL IN DEXTROSE 750-5 MG/150ML-% IV SOLN: 750.0000 mg | INTRAVENOUS | Status: DC

## 2018-04-08 MED ORDER — LACTATED RINGERS IV BOLUS
1000.0000 mL | Freq: Once | INTRAVENOUS | Status: AC
  Administered 2018-04-08: 1000 mL via INTRAVENOUS

## 2018-04-08 MED ORDER — POTASSIUM CHLORIDE 2 MEQ/ML IV SOLN
INTRAVENOUS | Status: DC
  Administered 2018-04-08: 17:00:00 via INTRAVENOUS
  Filled 2018-04-08 (×3): qty 1000

## 2018-04-08 MED ORDER — ATORVASTATIN CALCIUM 20 MG PO TABS: 40.0000 mg | ORAL_TABLET | Freq: Every day | ORAL | Status: DC

## 2018-04-08 MED ORDER — FENTANYL CITRATE (PF) 100 MCG/2ML IJ SOLN
INTRAMUSCULAR | Status: AC
  Administered 2018-04-08: 17:00:00
  Filled 2018-04-08: qty 2

## 2018-04-08 MED ORDER — FENTANYL CITRATE (PF) 100 MCG/2ML IJ SOLN
100.0000 ug | Freq: Once | INTRAMUSCULAR | Status: AC
  Administered 2018-04-08: 100 ug via INTRAVENOUS

## 2018-04-08 MED ORDER — EPINEPHRINE PF 1 MG/ML IJ SOLN
0.5000 ug/min | INTRAVENOUS | Status: DC
  Administered 2018-04-08: 8 ug/min via INTRAVENOUS
  Administered 2018-04-08: 20 ug/min via INTRAVENOUS
  Filled 2018-04-08: qty 4

## 2018-04-08 MED ORDER — INSULIN ASPART 100 UNIT/ML ~~LOC~~ SOLN: 0.0000 [IU] | SUBCUTANEOUS | Status: DC

## 2018-04-08 MED ORDER — VASOPRESSIN 20 UNIT/ML IV SOLN
0.0300 [IU]/min | INTRAVENOUS | Status: DC
  Administered 2018-04-08: 0.03 [IU]/min via INTRAVENOUS
  Filled 2018-04-08: qty 2

## 2018-04-08 MED ORDER — POTASSIUM CHLORIDE CRYS ER 20 MEQ PO TBCR
40.0000 meq | EXTENDED_RELEASE_TABLET | Freq: Two times a day (BID) | ORAL | Status: DC
  Administered 2018-04-08: 40 meq via ORAL
  Filled 2018-04-08: qty 2

## 2018-04-08 MED ORDER — FENTANYL 2500MCG IN NS 250ML (10MCG/ML) PREMIX INFUSION
INTRAVENOUS | Status: AC
  Administered 2018-04-08: 50 ug/h
  Filled 2018-04-08: qty 250

## 2018-04-08 MED ORDER — SODIUM CHLORIDE 0.9% FLUSH: 3.0000 mL | Freq: Two times a day (BID) | INTRAVENOUS | Status: DC

## 2018-04-08 MED ORDER — METRONIDAZOLE IN NACL 5-0.79 MG/ML-% IV SOLN
500.0000 mg | Freq: Three times a day (TID) | INTRAVENOUS | Status: DC
  Filled 2018-04-08 (×3): qty 100

## 2018-04-08 MED ORDER — SODIUM CHLORIDE 0.9% FLUSH: 3.0000 mL | INTRAVENOUS | Status: DC | PRN

## 2018-04-08 MED ORDER — HEPARIN BOLUS VIA INFUSION
1000.0000 [IU] | Freq: Once | INTRAVENOUS | Status: AC
  Administered 2018-04-08: 1000 [IU] via INTRAVENOUS
  Filled 2018-04-08: qty 1000

## 2018-04-09 LAB — TYPE AND SCREEN
ABO/RH(D): O POS
Antibody Screen: NEGATIVE
Unit division: 0
Unit division: 0

## 2018-04-09 LAB — BPAM RBC
Blood Product Expiration Date: 202004182359
Blood Product Expiration Date: 202004192359
ISSUE DATE / TIME: 202003311156
ISSUE DATE / TIME: 202003311853
Unit Type and Rh: 5100
Unit Type and Rh: 5100

## 2018-04-09 SURGERY — LEFT HEART CATH AND CORONARY ANGIOGRAPHY
Anesthesia: Moderate Sedation

## 2018-04-09 NOTE — Procedures (Signed)
Central Venous Catheter Placement:TRIPLE LUMEN Right femoral  Indication: Patient receiving vesicant or irritant drug.; Patient receiving intravenous therapy for longer than 5 days.; Patient has limited or no vascular access.   Consent:emergent    Hand washing performed prior to starting the procedure.   Procedure:   .   Patient was positioned correctly for central venous access.  Patient was prepped using strict sterile technique including chlorohexadine preps, sterile drape, sterile gown and sterile gloves.    The area was prepped, draped and anesthetized in the usual sterile manner. Patient comfort was obtained.    A triple lumen catheter was placed in right femoral vein There was good blood return, catheter caps were placed on lumens, catheter flushed easily, the line was secured and a sterile dressing and BIO-PATCH applied.   Ultrasound was used to visualize vasculature and guidance of needle.   Number of Attempts: 1 Complications:none Estimated Blood Loss: none     Vida Rigger, M.D.  Pulmonary & Critical Care Medicine  [facility]

## 2018-04-09 NOTE — Progress Notes (Signed)
Pt went into PEA -CPR started and continued for 18 mins. chaplin has spoken to son and is on the way. Time of death 50. E-Link notified to contact CDS.

## 2018-04-09 NOTE — Progress Notes (Signed)
CDS referral note:  Referral call made @   2025 CDS representative :  Eveline Keto Referral # :   [id number]  Pt not suitable for donation.

## 2018-04-09 NOTE — Procedures (Signed)
Endotracheal Intubation: Patient required placement of an artificial airway secondary to Respiratory Failure  Consent: Emergent.   Hand washing performed prior to starting the procedure.   Medications administered for sedation prior to procedure:   Fentanyl 100 mcg IV. Rocuronium 30   A time out procedure was called and correct patient, name, & ID confirmed. Needed supplies and equipment were assembled and checked to include ETT, 10 ml syringe, Glidescope, Mac and Miller blades, suction, oxygen and bag mask valve, end tidal CO2 monitor.   Patient was positioned to align the mouth and pharynx to facilitate visualization of the glottis.   Heart rate, SpO2 and blood pressure was continuously monitored during the procedure. Pre-oxygenation was conducted prior to intubation and endotracheal tube was placed through the vocal cords into the trachea.     The artificial airway was placed under direct visualization via glidescope route using a size 8 ETT on the first attempt.  ETT was secured at 21 cm mark.  Placement was confirmed by auscuitation of lungs with good breath sounds bilaterally and no stomach sounds.  Condensation was noted on endotracheal tube.   Pulse ox 98%.  CO2 detector in place with appropriate color change.   Complications: None .    Chest radiograph ordered and pending.   Comments: OGT placed via glidescope.    Ottie Glazier, M.D.  Pulmonary & [place]

## 2018-04-09 NOTE — Consult Note (Addendum)
CRITICAL CARE CONSULTATION      CHIEF COMPLAINT:   Hypotension and subsequent PEA s/p ACLS   HPI   Patient unable to give history as she is now on mechanical ventilation due to PEA s/p ACLS w/ROSC.  History obtained from son Fontaine No.  He reports patient had declining health over 4 years.  Her PMH includes NSTEMI 15 years ago, DM, disequilibrium.  She has been experiencing worsening weakness and PMD has been treating her for allergies and sinus infection which was treated with antibiotics. She had TTE done 04/03/2018 which showed EF25% but son states when she was seen by cardiology in [place] she had stress test done in 02/2017 with "clean bill of health".  Patient was noted to have PEA had received ACLS with 2 rounds of epi as well as 1 amp of bicarb and subsequently had epinephrine drip started, Rosc was achieved, patient was intubated and had right femoral central line placed.  Arterial blood gas shows severe acidosis with pH of 6.9 other parameters within reference range.  PAST MEDICAL HISTORY   Past Medical History:  Diagnosis Date  . Diabetes mellitus without complication ([place])   . MI (myocardial infarction) ([place])   . Thyroid disease      SURGICAL HISTORY   History reviewed. No pertinent surgical history.   FAMILY HISTORY   No family history on file.   SOCIAL HISTORY   Social History   Tobacco Use  . Smoking status: Never Smoker  . Smokeless tobacco: Never Used  Substance Use Topics  . Alcohol use: Never    Frequency: Never  . Drug use: Not on file     MEDICATIONS   Current Medication:  Current Facility-Administered Medications:  .  0.9 %  sodium chloride infusion (Manually program via Guardrails IV Fluids), , Intravenous, Once, Vaughan Basta, MD .  0.9 %  sodium chloride  infusion, , Intravenous, Continuous, Mayo, Pete Pelt, MD, Last Rate: 75 mL/hr at 03/27/2018 2211 .  0.9 %  sodium chloride infusion, 250 mL, Intravenous, PRN, Callwood, Dwayne D, MD .  Derrill Memo ON 04/09/2018] 0.9% sodium chloride infusion, 3 mL/kg/hr, Intravenous, Continuous **FOLLOWED BY** [START ON 04/09/2018] 0.9% sodium chloride infusion, 1 mL/kg/hr, Intravenous, Continuous, Callwood, Dwayne D, MD .  acetaminophen (TYLENOL) tablet 650 mg, 650 mg, Oral, Q6H PRN, 650 mg at 03/30/2018 2037 **OR** acetaminophen (TYLENOL) suppository 650 mg, 650 mg, Rectal, Q6H PRN, Mayo, Pete Pelt, MD .  Derrill Memo ON 04/09/2018] aspirin chewable tablet 81 mg, 81 mg, Oral, Pre-Cath, Callwood, Dwayne D, MD .  aspirin EC tablet 81 mg, 81 mg, Oral, Daily, Mayo, Pete Pelt, MD .  atorvastatin (LIPITOR) tablet 40 mg, 40 mg, Oral, q1800, Mayo, Pete Pelt, MD, 40 mg at 04/06/2018 2037 .  carvedilol (COREG) tablet 3.125 mg, 3.125 mg, Oral, BID WC, Mayo, Pete Pelt, MD, 3.125 mg at 05-03-2018 0929 .  EPINEPHrine (ADRENALIN) 4 mg in dextrose 5 % 250 mL (0.016 mg/mL) infusion, 0.5-20 mcg/min, Intravenous, Titrated, Alwyn Cordner, MD, Last Rate: 75 mL/hr at May 03, 2018 1602, 20 mcg/min at 05-03-18 1602 .  feeding supplement (NEPRO CARB STEADY) liquid 237 mL, 237 mL, Oral, BID BM, Vaughan Basta, MD .  fentaNYL (SUBLIMAZE) 100 MCG/2ML injection, , , ,  .  fentaNYL 10 mcg/ml infusion, , , ,  .  insulin aspart (novoLOG) injection 0-5 Units, 0-5 Units, Subcutaneous, QHS, Mayo, Pete Pelt, MD, 5 Units at 03/16/2018 2039 .  insulin aspart (novoLOG) injection 0-9 Units, 0-9 Units, Subcutaneous, TID WC, Mayo,  Pete Pelt, MD, 3 Units at 04/15/2018 1238 .  lactated ringers 1,000 mL with potassium chloride 40 mEq infusion, , Intravenous, Continuous, Ottie Glazier, MD .  Derrill Memo ON 04/09/2018] multivitamin with minerals tablet 1 tablet, 1 tablet, Oral, Daily, Vaughan Basta, MD .  norepinephrine (LEVOPHED) 54m in 258mpremix infusion, 0-40 mcg/min,  Intravenous, Titrated, Reece Fehnel, MD .  ondansetron (ZOFRAN) tablet 4 mg, 4 mg, Oral, Q6H PRN **OR** ondansetron (ZOFRAN) injection 4 mg, 4 mg, Intravenous, Q6H PRN, Mayo, KaPete PeltMD .  polyethylene glycol (MIRALAX / GLYCOLAX) packet 17 g, 17 g, Oral, Daily PRN, Mayo, KaPete PeltMD .  sodium chloride flush (NS) 0.9 % injection 3 mL, 3 mL, Intravenous, Once, SiArta SilenceMD .  sodium chloride flush (NS) 0.9 % injection 3 mL, 3 mL, Intravenous, Q12H, Callwood, Dwayne D, MD .  sodium chloride flush (NS) 0.9 % injection 3 mL, 3 mL, Intravenous, PRN, Callwood, Dwayne D, MD .  traMADol (ULTRAM) tablet 50 mg, 50 mg, Oral, Q8H PRN, Avaiya, Hitesh, MD, 50 mg at [id number] .  vasopressin (PITRESSIN) 40 Units in sodium chloride 0.9 % 250 mL (0.16 Units/mL) infusion, 0.03 Units/min, Intravenous, Continuous, Paulo Keimig, MD    ALLERGIES   Latex    REVIEW OF SYSTEMS    Unable to obtain due to unresponsive state PHYSICAL EXAMINATION   Vitals:   [id number]-Apr-[id number]-Apr-[id number]  BP: (!) 92/54   Pulse: 98   Resp:    Temp: 98.5 F (36.9 C)   SpO2: 98% 98%    GENERAL: Unresponsive GCS 5 HEAD: Normocephalic, atraumatic.  EYES: Pupils equal, round, reactive to light.  No scleral icterus.  MOUTH: Moist mucosal membrane. NECK: Supple. No thyromegaly. No nodules. No JVD.  PULMONARY: Mild bilateral rhonchi CARDIOVASCULAR: Distant heart sounds no murmur appreciated GASTROINTESTINAL: Mildly distended abdomen soft no fluid wave MUSCULOSKELETAL: No swelling, clubbing, or edema.  NEUROLOGIC: Mild distress due to acute illness SKIN:intact,warm,dry   LABS AND IMAGING     -I personally reviewed most recent blood work, imaging and microbiology - significant findings today are hypokalemia, metabolic acidosis hyperglycemia, severe anemia leukocytosis, thrombocytopenia  LAB RESULTS: Recent Labs  Lab 03/30/2018 1157 [id number]/07/2020409  NA 135 138  K 2.2* 2.6*  CL 105 109  CO2 15* 14*   BUN 65* 61*  CREATININE 1.26* 0.98  GLUCOSE 372* 299*   Recent Labs  Lab 04/02/2018 1157 04/04/2018 2025 0304/07/20409  HGB 8.4* 8.2* 6.9*  HCT 26.8* 27.0* 22.1*  WBC 15.4* 15.7* 13.4*  PLT 85* 76* 68*     IMAGING RESULTS: Ct Angio Chest Pe W Or Wo Contrast  Result Date: 03/28/2018 CLINICAL DATA:  Generalized weakness. LEFT lower extremity edema. EXAM: CT ANGIOGRAPHY CHEST WITH CONTRAST TECHNIQUE: Multidetector CT imaging of the chest was performed using the standard protocol during bolus administration of intravenous contrast. Multiplanar CT image reconstructions and MIPs were obtained to evaluate the vascular anatomy. CONTRAST:  6076mMNIPAQUE IOHEXOL 350 MG/ML SOLN COMPARISON:  Chest radiograph earlier today. FINDINGS: Considerable respiratory motion decreases sensitivity and specificity. Overall study is grossly diagnostic. Cardiovascular: Satisfactory opacification of the pulmonary arteries to the segmental level. No evidence of pulmonary embolism. Cardiomegaly. No pericardial effusion. Aortic atherosclerosis. Mediastinum/Nodes: No enlarged mediastinal, hilar, or axillary lymph nodes. Thyroid gland, trachea, and esophagus demonstrate no significant findings. Lungs/Pleura: Mild dependent edema, and small BILATERAL effusions, greater on the LEFT. Mild LEFT basilar atelectasis. Upper Abdomen: No acute abnormality. Musculoskeletal: Unremarkable. Review of the MIP images confirms the  above findings. IMPRESSION: 1. No evidence for pulmonary emboli. 2. Mild dependent edema, and small BILATERAL effusions, greater on the LEFT. Aortic Atherosclerosis (ICD10-I70.0). Electronically Signed   By: Staci Righter M.D.   On: 03/25/2018 18:29   US Venous Img Lower Unilateral Left  Result Date: 03/10/2018 CLINICAL DATA:  LEFT leg swelling. EXAM: LEFT LOWER EXTREMITY VENOUS DOPPLER ULTRASOUND TECHNIQUE: Gray-scale sonography with graded compression, as well as color Doppler and duplex ultrasound were performed  to evaluate the lower extremity deep venous systems from the level of the common femoral vein and including the common femoral, femoral, profunda femoral, popliteal and calf veins including the posterior tibial, peroneal and gastrocnemius veins when visible. The superficial great saphenous vein was also interrogated. Spectral Doppler was utilized to evaluate flow at rest and with distal augmentation maneuvers in the common femoral, femoral and popliteal veins. COMPARISON:  Chest CT earlier today demonstrates no evidence of pulmonary emboli. FINDINGS: Contralateral Common Femoral Vein: Respiratory phasicity is normal and symmetric with the symptomatic side. No evidence of thrombus. Normal compressibility. Common Femoral Vein: No evidence of thrombus. Normal compressibility, respiratory phasicity and response to augmentation. Saphenofemoral Junction: No evidence of thrombus. Normal compressibility and flow on color Doppler imaging. Profunda Femoral Vein: No evidence of thrombus. Normal compressibility and flow on color Doppler imaging. Femoral Vein: No evidence of thrombus. Normal compressibility, respiratory phasicity and response to augmentation. Popliteal Vein: No evidence of thrombus. Normal compressibility, respiratory phasicity and response to augmentation. Calf Veins: Poorly visualized calf veins. Superficial Great Saphenous Vein: No evidence of thrombus. Normal compressibility. Venous Reflux:  None. Other Findings:  None. IMPRESSION: No evidence of LEFT lower extremity deep venous thrombosis. Electronically Signed   By: Staci Righter M.D.   On: 04/03/2018 19:07      ASSESSMENT AND PLAN     Acute blood loss anemia -Unclear source as of yet-patient with distended abdomen possible retroperitoneal-ultrasound assessment without free fluid at Morison's pouch and splenorenal recess -No rectal bleeds or hematemesis per history from family and hospitalist while on medical floor -Peripheral blood film  reviewed by pathologist- schistocytes seen indicative of MAHA-will obtain blood work for hemolytic anemia, DIC, TTP, as well as septic work-up -continue Full MV support -continue Bronchodilator Therapy -Wean Fio2 and PEEP as tolerated -will perform SAT/SBT when respiratory parameters are met   CARDIAC FAILURE- Patient found to be in PEA status post ACLS with ROSC -Baseline systolic CHF with EF of 20 to 25% -oxygen as needed -follow up cardiac enzymes as indicated ICU monitoring  Renal Failure-most likely due to ATN -follow chem 7 -follow UO -continue Foley Catheter-assess need daily   NEUROLOGY - intubated and sedated - minimal sedation to achieve a RASS goal: -1 Wake up assessment pending   Septic shock -Possible bacteremia septic work-up pending -use vasopressors to keep MAP>65 -follow ABG and LA -follow up cultures -emperic ABX -consider stress dose steroids   ID -continue IV abx as prescibed -follow up cultures  GI/Nutrition GI PROPHYLAXIS as indicated DIET-->TF's as tolerated Constipation protocol as indicated  ENDO - ICU hypoglycemic\Hyperglycemia protocol -check FSBS per protocol   ELECTROLYTES -follow labs as needed -replace as needed -pharmacy consultation   DVT/GI PRX ordered -SCDs  TRANSFUSIONS AS NEEDED MONITOR FSBS ASSESS the need for LABS as needed   Critical care provider statement:    Critical care time (minutes):  41   Critical care time was exclusive of:  Separately billable procedures and treating other patients   Critical care was necessary to  treat or prevent imminent or life-threatening deterioration of the following conditions:   Pulseless electrical activity status post ACLS, circulatory shock possible hypovolemic versus distributive, severe systolic CHF with EF of 20 to 25%, multiple comorbid conditions   Critical care was time spent personally by me on the following activities:  Development of treatment plan with patient or  surrogate, discussions with consultants, evaluation of patient's response to treatment, examination of patient, obtaining history from patient or surrogate, ordering and performing treatments and interventions, ordering and review of laboratory studies and re-evaluation of patient's condition.  I assumed direction of critical care for this patient from another provider in my specialty: no    This document was prepared using Dragon voice recognition software and may include unintentional dictation errors.    Ottie Glazier, M.D.  Division of [place]

## 2018-04-09 NOTE — Death Summary Note (Signed)
DEATH SUMMARY   Patient Details  Name: Stacy Duran MRN: [id number] DOB: 01-26-1945  Admission/Discharge Information   Admit Date:  04-20-2018  Date of Death:  04-21-2018  Time of Death:  1938  Length of Stay: 1  Referring Physician: System, Pcp Not In   Reason(s) for Hospitalization  Elevated Troponin  Diagnoses  Preliminary cause of death:  Secondary Diagnoses (including complications and co-morbidities):  Active Problems:   Elevated troponin NSTEMI Acute Hypoxic Respiratory Failure Metabolic Acidosis secondary to Lactic Acidosis AKI on CKD Anemia Hyperkalemia  Brief Hospital Course (including significant findings, care, treatment, and services provided and events leading to death)  Stacy Duran is a 73 y.o. year old female who presented to [facility] ED on 04/20/18 with complaints of Weakness and anorexia for several days.  She endorsed body aches, and spending more time in bed due to weakness.  She also endorsed mild shortness of breath exacerbated with exertion, denied chest pain.  Initial workup in the ED revealed tachycardia, potassium 2.2, Creatinine 1.26, WBC 15.4, Hemoglobin 8.4, Lactic acid 2.0, and troponin 3.26.  Urinalysis was unremarkable and CXR showing cardiomegaly but without any acute pulmonary abnormalities.  She was placed on heparin drip and admitted to [facility] unit for treatment of elevated troponin, Acute hypoxic respiratory failure, lactic acidosis, AKI, and Hypokalemia.  On Apr 21, 2018, she was noted to have worsening anemia with concern for possible hemolysis on pathology review, and thrombocytopenia of which Hematology was consulted.  Heparin drip was stopped. Pt also with worsening lactic acidosis and hypotension requiring transfer to ICU.  Shortly after arrival to ICU at 1547, she was noted to have PEA cardiac arrest.  CPR and ACLS was initiated, of which she received 2 rounds of epi and 1 amp of Bicarb with ROCS obtained.  She was intubated during the  code, and follow up ABG showed severe metabolic acidosis with pH 6.9.  She was on maximum support with Levophed, Vasopressin, and Epinephrine drips.  Despite maximum support, she had another PEA arrest at 1848, of which she received multiple rounds of epi, Calcium, Bicarb, magnesium, and emergent blood.  ROSC was obtained after approximately 18 minutes of ACLS.   Pt had one final episode of PEA arrest at 1920 of which she again received multiple rounds of CPR and ACLS with epi, calcium, and bicarb.  Pt was unresponsive and showed signs of severe anoxic injury.  Code was called and time of death pronounced at 1938.      Pertinent Labs and Studies  Significant Diagnostic Studies Ct Angio Chest Pe W Or Wo Contrast  Result Date: April 20, 2018 CLINICAL DATA:  Generalized weakness. LEFT lower extremity edema. EXAM: CT ANGIOGRAPHY CHEST WITH CONTRAST TECHNIQUE: Multidetector CT imaging of the chest was performed using the standard protocol during bolus administration of intravenous contrast. Multiplanar CT image reconstructions and MIPs were obtained to evaluate the vascular anatomy. CONTRAST:  39mL OMNIPAQUE IOHEXOL 350 MG/ML SOLN COMPARISON:  Chest radiograph earlier today. FINDINGS: Considerable respiratory motion decreases sensitivity and specificity. Overall study is grossly diagnostic. Cardiovascular: Satisfactory opacification of the pulmonary arteries to the segmental level. No evidence of pulmonary embolism. Cardiomegaly. No pericardial effusion. Aortic atherosclerosis. Mediastinum/Nodes: No enlarged mediastinal, hilar, or axillary lymph nodes. Thyroid gland, trachea, and esophagus demonstrate no significant findings. Lungs/Pleura: Mild dependent edema, and small BILATERAL effusions, greater on the LEFT. Mild LEFT basilar atelectasis. Upper Abdomen: No acute abnormality. Musculoskeletal: Unremarkable. Review of the MIP images confirms the above findings. IMPRESSION: 1. No evidence for pulmonary emboli.  2.  Mild dependent edema, and small BILATERAL effusions, greater on the LEFT. Aortic Atherosclerosis (ICD10-I70.0). Electronically Signed   By: Elsie Stain M.D.   On: 05/04/2018 18:29   US Venous Img Lower Unilateral Left  Result Date: 2018/05/04 CLINICAL DATA:  LEFT leg swelling. EXAM: LEFT LOWER EXTREMITY VENOUS DOPPLER ULTRASOUND TECHNIQUE: Gray-scale sonography with graded compression, as well as color Doppler and duplex ultrasound were performed to evaluate the lower extremity deep venous systems from the level of the common femoral vein and including the common femoral, femoral, profunda femoral, popliteal and calf veins including the posterior tibial, peroneal and gastrocnemius veins when visible. The superficial great saphenous vein was also interrogated. Spectral Doppler was utilized to evaluate flow at rest and with distal augmentation maneuvers in the common femoral, femoral and popliteal veins. COMPARISON:  Chest CT earlier today demonstrates no evidence of pulmonary emboli. FINDINGS: Contralateral Common Femoral Vein: Respiratory phasicity is normal and symmetric with the symptomatic side. No evidence of thrombus. Normal compressibility. Common Femoral Vein: No evidence of thrombus. Normal compressibility, respiratory phasicity and response to augmentation. Saphenofemoral Junction: No evidence of thrombus. Normal compressibility and flow on color Doppler imaging. Profunda Femoral Vein: No evidence of thrombus. Normal compressibility and flow on color Doppler imaging. Femoral Vein: No evidence of thrombus. Normal compressibility, respiratory phasicity and response to augmentation. Popliteal Vein: No evidence of thrombus. Normal compressibility, respiratory phasicity and response to augmentation. Calf Veins: Poorly visualized calf veins. Superficial Great Saphenous Vein: No evidence of thrombus. Normal compressibility. Venous Reflux:  None. Other Findings:  None. IMPRESSION: No evidence of LEFT lower  extremity deep venous thrombosis. Electronically Signed   By: Elsie Stain M.D.   On: 2018/05/04 19:07   Dg Chest Portable 1 View  Result Date: 05/04/18 CLINICAL DATA:  Three-week history weakness.  Hypoxia. EXAM: PORTABLE CHEST 1 VIEW COMPARISON:  None. FINDINGS: The heart is enlarged. There is no edema or effusion. The visualized soft tissues and bony thorax are unremarkable. IMPRESSION: Cardiomegaly without failure. Electronically Signed   By: Marin Roberts M.D.   On: 05-04-2018 14:37    Microbiology Recent Results (from the past 240 hour(s))  Respiratory Panel by PCR     Status: None   Collection Time: 04-May-2018  6:38 PM  Result Value Ref Range Status   Adenovirus NOT DETECTED NOT DETECTED Final   Coronavirus 229E NOT DETECTED NOT DETECTED Final    Comment: (NOTE) The Coronavirus on the Respiratory Panel, DOES NOT test for the novel  Coronavirus (2019 nCoV)    Coronavirus HKU1 NOT DETECTED NOT DETECTED Final   Coronavirus NL63 NOT DETECTED NOT DETECTED Final   Coronavirus OC43 NOT DETECTED NOT DETECTED Final   Metapneumovirus NOT DETECTED NOT DETECTED Final   Rhinovirus / Enterovirus NOT DETECTED NOT DETECTED Final   Influenza A NOT DETECTED NOT DETECTED Final   Influenza B NOT DETECTED NOT DETECTED Final   Parainfluenza Virus 1 NOT DETECTED NOT DETECTED Final   Parainfluenza Virus 2 NOT DETECTED NOT DETECTED Final   Parainfluenza Virus 3 NOT DETECTED NOT DETECTED Final   Parainfluenza Virus 4 NOT DETECTED NOT DETECTED Final   Respiratory Syncytial Virus NOT DETECTED NOT DETECTED Final   Bordetella pertussis NOT DETECTED NOT DETECTED Final   Chlamydophila pneumoniae NOT DETECTED NOT DETECTED Final   Mycoplasma pneumoniae NOT DETECTED NOT DETECTED Final    Comment: Performed at [facility], [address], [place], [place] [address]    Lab Basic Metabolic Panel: Recent Labs  Lab Apr 21, 2018 1157 21-Apr-2018 1417 04/05/2018 0409 03/27/2018 0816 04/04/2018 1832  NA  135  --  138  --  134*  K 2.2*  --  2.6*  --  6.7*  CL 105  --  109  --  108  CO2 15*  --  14*  --  12*  GLUCOSE 372*  --  299*  --  518*  BUN 65*  --  61*  --  58*  CREATININE 1.26*  --  0.98  --  1.41*  CALCIUM 7.7*  --  7.5*  --  7.0*  MG  --  2.4  --  2.2  --   PHOS  --   --   --   --  7.1*   Liver Function Tests: Recent Labs  Lab 21-Apr-2018 1837 03/22/2018 1832  AST 27 108*  ALT 13 32  ALKPHOS 1,000* 891*  BILITOT 0.6 0.7  PROT 5.2* 4.3*  ALBUMIN 2.1* 1.6*   No results for input(s): LIPASE, AMYLASE in the last 168 hours. No results for input(s): AMMONIA in the last 168 hours. CBC: Recent Labs  Lab 21-Apr-2018 1157 2018/04/21 2025 03/17/2018 0409 03/20/2018 1832  WBC 15.4* 15.7* 13.4* 33.3*  NEUTROABS  --  10.9*  --   --   HGB 8.4* 8.2* 6.9* 8.6*  8.7*  HCT 26.8* 27.0* 22.1* 30.1*  MCV 87.9 93.4 88.4 97.7  PLT 85* 76* 68* 84*   Cardiac Enzymes: Recent Labs  Lab 2018/04/21 1417 2018-04-21 1837 04/21/2018 2349 03/23/2018 0409  TROPONINI 3.26* 2.91* 3.74* 3.70*   Sepsis Labs: Recent Labs  Lab Apr 21, 2018 1157 21-Apr-2018 1417 April 21, 2018 1541 04/21/2018 1938 April 21, 2018 2025 03/26/2018 0409 03/24/2018 1832  WBC 15.4*  --   --   --  15.7* 13.4* 33.3*  LATICACIDVEN  --  2.0* 2.9* 2.6*  --   --   --     Procedures/Operations  Inbutation 03/16/2018 Femoral CVC 04/05/2018   Harlon Ditty, AGACNP-BC [facility] Pager: [phone] Cell: [phone]  Judithe Modest 03/17/2018, 8:06 PM

## 2018-04-09 NOTE — Progress Notes (Addendum)
1524 received patient via bed from 2 A. Chg bath given upon admission. Patient constantly moaning but denied pain. Would not answer any questions except her name. Yelled no every time she was moved. Grips equal but weak and patient needed lots of prompting. Did not react to painful stimuli at all. Pupils 4 mm and sluggish bilaterally. 1547 patient heart rate slowed then  patient  coded-see code sheet.  Patient intubated during code.Central line inserted per [facility] after code for access.  Patient started on Levophed drip, Epinephrine drip, Vasopressin drip, Bicarb. Drip per orders. Patient coded again. -see code sheet.  Given stat unit of blood during second code. And also coded third time- see code shee.

## 2018-04-09 NOTE — Progress Notes (Signed)
Initial Nutrition Assessment  DOCUMENTATION CODES:   Not applicable  INTERVENTION:   RD will order supplements once diet advanced  NUTRITION DIAGNOSIS:   Inadequate oral intake related to acute illness as evidenced by per patient/family report.  GOAL:   Patient will meet greater than or equal to 90% of their needs  MONITOR:   PO intake, Supplement acceptance, Labs, Weight trends, I & O's, Skin  REASON FOR ASSESSMENT:   Malnutrition Screening Tool    ASSESSMENT:   73 y.o. female with a known history of CAD s/p MI, type 2 diabetes, hypothyroidism who presented to the ED with generalized weakness, decreased appetite and poor p.o. intake.   RD working remotely.  Pt reports poor appetite and oral intake for several days pta. Pt currently NPO. There is no weight history in chart to determine if any recent weight loss. RD will order supplements once diet advanced.    Medications reviewed and include: aspirin, insulin, KCl  Labs reviewed: K 2.3(L), BUN 61(H), Mg 2.2 wnl Wbc- 13.4(H), Hgb 6.9(L), Hct 22.1(L) cbgs- 372, 255 x 24 hrs  Unable to complete Nutrition-Focused physical exam at this time.   Diet Order:   Diet Order            Diet NPO time specified Except for: Ice Chips, Sips with Meds  Diet effective now             EDUCATION NEEDS:   Not appropriate for education at this time  Skin:  Skin Assessment: Reviewed RN Assessment  Last BM:  3/29  Height:   Ht Readings from Last 1 Encounters:  04/04/2018 5\' 3"  (1.6 m)    Weight:   Wt Readings from Last 1 Encounters:  04/06/2018 59 kg    Ideal Body Weight:  52.3 kg  BMI:  Body mass index is 23.03 kg/m.  Estimated Nutritional Needs:   Kcal:  1400-1600kcal/day   Protein:  60-70g/day   Fluid:  1.4L/day   Betsey Holiday MS, RD, LDN Pager #- [phone] Office#- [phone] After Hours Pager: [phone]

## 2018-04-09 NOTE — Consult Note (Signed)
ANTICOAGULATION CONSULT NOTE - Initial Consult  Pharmacy Consult for heparin  Indication: chest pain/ACS  Allergies  Allergen Reactions  . Latex Rash    Patient Measurements: Height: 5\' 3"  (160 cm) Weight: 130 lb (59 kg) IBW/kg (Calculated) : 52.4 Heparin Dosing Weight: 59 kg   Vital Signs: Temp: 96.7 F (35.9 C) (03/30 1952) Temp Source: Oral (03/30 1522) BP: 109/59 (03/30 1952) Pulse Rate: 93 (03/30 1952)  Labs: Recent Labs    2018-04-21 1157 Apr 21, 2018 1417 April 21, 2018 1607 Apr 21, 2018 1837 04-21-2018 2025 Apr 21, 2018 2138  HGB 8.4*  --   --   --  8.2*  --   HCT 26.8*  --   --   --  27.0*  --   PLT 85*  --   --   --  76*  --   APTT  --   --  33  --   --   --   LABPROT  --   --  16.1*  --   --   --   INR  --   --  1.3*  --   --   --   HEPARINUNFRC  --   --   --   --   --  <0.10*  CREATININE 1.26*  --   --   --   --   --   TROPONINI  --  3.26*  --  2.91*  --   --      Estimated Creatinine Clearance: 32.9 mL/min (A) (by C-G formula based on SCr of 1.26 mg/dL (H)).   Medical History: Past Medical History:  Diagnosis Date  . Diabetes mellitus without complication (HCC)   . MI (myocardial infarction) (HCC)   . Thyroid disease     Medications:  Medications Prior to Admission  Medication Sig Dispense Refill Last Dose  . fluticasone (FLONASE) 50 MCG/ACT nasal spray Place 1 spray into both nostrils 2 (two) times daily.   04/21/18 at Unknown time   Scheduled:  . aspirin EC  81 mg Oral Daily  . atorvastatin  40 mg Oral q1800  . carvedilol  3.125 mg Oral BID WC  . heparin  1,000 Units Intravenous Once  . insulin aspart  0-5 Units Subcutaneous QHS  . insulin aspart  0-9 Units Subcutaneous TID WC  . potassium chloride  40 mEq Oral TID  . sodium chloride flush  3 mL Intravenous Once   Infusions:  . sodium chloride 75 mL/hr at 21-Apr-2018 2211  . heparin 700 Units/hr (04-21-18 1703)   PRN:   Assessment: Pharmacy consulted for ACS; the tropinin level is elevated. However,  her platelet count is indicative of thrombocytopenia. Therefore, a bolus dose was not given. Will need to assess platelet count moving forward.. Patient does not have a h/o anticoagulation per chart review. The medication history was not completed at the time, but home medication was confirmed with her son "Tasia Catchings" Chrissie Noa). He noted that he is not aware of his mother taking "blood thinners" but noted she does take a "baby aspirin" each day.   Goal of Therapy:  Heparin level 0.3-0.7 units/ml Monitor platelets by anticoagulation protocol: Yes   Plan:  03/30 @ 2138 HL < 0.10 subtherapeutic. Will conservatively bolus w/ heparin 1000 units IV x 1 considering patient's Hgb is low @ 8.4 >> 8.2, unsure of baseline hgb/plts, but since admission plts 85 >> 76. Will increase rate to 900 units/hr and will recheck HL @ 0800, will continue to monitor CBC.  Thomasene Ripple, PharmD, BCPS  Clinical Pharmacist May 06, 2018

## 2018-04-09 NOTE — Progress Notes (Signed)
Inpatient Diabetes Program Recommendations  AACE/ADA: New Consensus Statement on Inpatient Glycemic Control  Target Ranges:  Prepandial:   less than 140 mg/dL      Peak postprandial:   less than 180 mg/dL (1-2 hours)      Critically ill patients:  140 - 180 mg/dL   Results for Stacy Duran, Stacy Duran (MRN [id number]) as of 2018-05-07 07:56  Ref. Range 03/29/2018 20:21  Glucose-Capillary Latest Ref Range: 70 - 99 mg/dL 425 (H)   Results for Stacy Duran, Stacy Duran (MRN [id number]) as of May 07, 2018 07:56  Ref. Range 03/22/2018 11:57 May 07, 2018 04:09  Glucose Latest Ref Range: 70 - 99 mg/dL 332 (H) 951 (H)   Review of Glycemic Control  Diabetes history: DM2 Outpatient Diabetes medications: None Current orders for Inpatient glycemic control: Novolog 0-9 units TID with meals, Novolog 0-5 units QHS  Inpatient Diabetes Program Recommendations:  Insulin - Basal: Please consider ordering Lantus 6 units daily. HgbA1C: A1C in process.  NOTE: In reviewing chart, noted patient has DM2 hx but no DM medications listed on home medication list.  Initial lab glucose was 372 mg/dl on 8/84/16 and S0Y is in process.     Thanks, Orlando Penner, RN, MSN, CDE Diabetes Coordinator Inpatient Diabetes Program [phone] (Team Pager from 8am to 5pm)

## 2018-04-09 NOTE — Consult Note (Signed)
Reason for Consult: Elevated troponin possible non-STEMI hypotension Referring Physician: Dr. Nancy Marus hospitalist  Stacy Duran is an 73 y.o. female.  HPI: Patient is a 73 year old female with multiple medical problems brought in because of generalized weakness fatigue now found to be profoundly anemic she also had elevated troponin of 3 denied any significant discrete chest pain does complain of generalized weakness and fatigue mild shortness of breath denies any leg edema denies any previous cardiac history.  Patient also found to have elevated lactic acid profound hypokalemia and elevated blood sugars patient denies any discrete pain but with her worsening generalized weakness she was brought to the emergency room and admitted for further assessment for possible non-STEMI.  Treatment is been difficult because of significant hypotension  Past Medical History:  Diagnosis Date  . Diabetes mellitus without complication (HCC)   . MI (myocardial infarction) (HCC)   . Thyroid disease     History reviewed. No pertinent surgical history.  No family history on file.  Social History:  reports that she has never smoked. She has never used smokeless tobacco. She reports that she does not drink alcohol. No history on file for drug.  Allergies:  Allergies  Allergen Reactions  . Latex Rash    Medications: I have reviewed the patient's current medications.  Results for orders placed or performed during the hospital encounter of 04-13-18 (from the past 48 hour(s))  Basic metabolic panel     Status: Abnormal   Collection Time: April 13, 2018 11:57 AM  Result Value Ref Range   Sodium 135 135 - 145 mmol/L   Potassium 2.2 (LL) 3.5 - 5.1 mmol/L    Comment: CRITICAL RESULT CALLED TO, READ BACK BY AND VERIFIED WITH LINDA MCLAMB  04-13-2018 MU/DAS    Chloride 105 98 - 111 mmol/L   CO2 15 (L) 22 - 32 mmol/L   Glucose, Bld 372 (H) 70 - 99 mg/dL   BUN 65 (H) 8 - 23 mg/dL   Creatinine, Ser 1.61 (H) 0.44 -  1.00 mg/dL   Calcium 7.7 (L) 8.9 - 10.3 mg/dL   GFR calc non Af Amer 42 (L) >60 mL/min   GFR calc Af Amer 49 (L) >60 mL/min   Anion gap 15 5 - 15    Comment: Performed at [facility], [address] [place], [place] [address]  CBC     Status: Abnormal   Collection Time: April 13, 2018 11:57 AM  Result Value Ref Range   WBC 15.4 (H) 4.0 - 10.5 K/uL   RBC 3.05 (L) 3.87 - 5.11 MIL/uL   Hemoglobin 8.4 (L) 12.0 - 15.0 g/dL   HCT 54.0 (L) 98.1 - 19.1 %   MCV 87.9 80.0 - 100.0 fL   MCH 27.5 26.0 - 34.0 pg   MCHC 31.3 30.0 - 36.0 g/dL   RDW 47.8 (H) 29.5 - 62.1 %   Platelets 85 (L) 150 - 400 K/uL    Comment: Immature Platelet Fraction may be clinically indicated, consider ordering this additional test [id number]    nRBC 1.5 (H) 0.0 - 0.2 %    Comment: Performed at [facility], [address] [place], [place] [address]  ABO/Rh     Status: None   Collection Time: 04/13/18 11:57 AM  Result Value Ref Range   ABO/RH(D)      O POS Performed at [facility], [address] [place], [place] [address]   Urinalysis, Complete w Microscopic     Status: Abnormal   Collection Time: 04-13-2018  1:02 PM  Result Value Ref Range   Color, Urine YELLOW (A) YELLOW   APPearance HAZY (A) CLEAR   Specific Gravity, Urine 1.023 1.005 - 1.030   pH 5.0 5.0 - 8.0   Glucose, UA 50 (A) NEGATIVE mg/dL   Hgb urine dipstick NEGATIVE NEGATIVE   Bilirubin Urine NEGATIVE NEGATIVE   Ketones, ur NEGATIVE NEGATIVE mg/dL   Protein, ur 30 (A) NEGATIVE mg/dL   Nitrite NEGATIVE NEGATIVE   Leukocytes,Ua NEGATIVE NEGATIVE   RBC / HPF 0-5 0 - 5 RBC/hpf   WBC, UA 6-10 0 - 5 WBC/hpf   Bacteria, UA NONE SEEN NONE SEEN   Squamous Epithelial / LPF 0-5 0 - 5   Mucus PRESENT    Hyaline Casts, UA PRESENT     Comment: Performed at [facility], [address] [place], [place] [address]  Troponin I - Once     Status: Abnormal   Collection Time: 03/29/2018  2:17 PM  Result Value Ref Range    Troponin I 3.26 (HH) <0.03 ng/mL    Comment: CRITICAL RESULT CALLED TO, READ BACK BY AND VERIFIED WITH KIM GAULT AT 1450 03/30/2018 DAS Performed at [facility], [address] [place], [place] [address]   Lactic acid, plasma     Status: Abnormal   Collection Time: 03/09/2018  2:17 PM  Result Value Ref Range   Lactic Acid, Venous 2.0 (HH) 0.5 - 1.9 mmol/L    Comment: CRITICAL RESULT CALLED TO, READ BACK BY AND VERIFIED WITH JEANNETTE PEREZ AT 1533 03/23/2018 DAS Performed at [facility], [address] [place], [place] [address]   Magnesium     Status: None   Collection Time: 03/13/2018  2:17 PM  Result Value Ref Range   Magnesium 2.4 1.7 - 2.4 mg/dL    Comment: Performed at [facility], [address] [place], [place] [address]  Lactic acid, plasma     Status: Abnormal   Collection Time: 03/12/2018  3:41 PM  Result Value Ref Range   Lactic Acid, Venous 2.9 (HH) 0.5 - 1.9 mmol/L    Comment: CRITICAL RESULT CALLED TO, READ BACK BY AND VERIFIED WITH JANETTE PEREZ AT 1612 04/04/2018.  TFK Performed at [facility], [address] [place], [place] [address]   Protime-INR     Status: Abnormal   Collection Time: 03/27/2018  4:07 PM  Result Value Ref Range   Prothrombin Time 16.1 (H) 11.4 - 15.2 seconds   INR 1.3 (H) 0.8 - 1.2    Comment: (NOTE) INR goal varies based on device and disease states. Performed at [facility], [address] [place], [place] [address]   APTT     Status: None   Collection Time: 04/05/2018  4:07 PM  Result Value Ref Range   aPTT 33 24 - 36 seconds    Comment: Performed at [facility], [address] [place], [place] [address]  Troponin I - Now Then Q6H     Status: Abnormal   Collection Time: 03/30/2018  6:37 PM  Result Value Ref Range   Troponin I 2.91 (HH) <0.03 ng/mL    Comment: CRITICAL VALUE NOTED. VALUE IS CONSISTENT WITH PREVIOUSLY REPORTED/CALLED VALUE.  TFK Performed at [facility], [address] [place], [place] [address]   Hepatic function panel     Status: Abnormal   Collection Time: 04/06/2018  6:37 PM  Result Value Ref Range   Total Protein 5.2 (L) 6.5 - 8.1  g/dL   Albumin 2.1 (L) 3.5 - 5.0 g/dL   AST 27 15 - 41 U/L   ALT 13 0 - 44 U/L   Alkaline Phosphatase 1,000 (H) 38 - 126 U/L   Total Bilirubin 0.6 0.3 - 1.2 mg/dL   Bilirubin, Direct 0.2 0.0 - 0.2 mg/dL   Indirect Bilirubin 0.4 0.3 - 0.9 mg/dL    Comment: Performed at [facility], [address] [place], [place] [address]  Respiratory Panel by PCR     Status: None   Collection Time: 05-03-2018  6:38 PM  Result Value Ref Range   Adenovirus NOT DETECTED NOT DETECTED   Coronavirus 229E NOT DETECTED NOT DETECTED    Comment: (NOTE) The Coronavirus on the Respiratory Panel, DOES NOT test for the novel  Coronavirus (2019 nCoV)    Coronavirus HKU1 NOT DETECTED NOT DETECTED   Coronavirus NL63 NOT DETECTED NOT DETECTED   Coronavirus OC43 NOT DETECTED NOT DETECTED   Metapneumovirus NOT DETECTED NOT DETECTED   Rhinovirus / Enterovirus NOT DETECTED NOT DETECTED   Influenza A NOT DETECTED NOT DETECTED   Influenza B NOT DETECTED NOT DETECTED   Parainfluenza Virus 1 NOT DETECTED NOT DETECTED   Parainfluenza Virus 2 NOT DETECTED NOT DETECTED   Parainfluenza Virus 3 NOT DETECTED NOT DETECTED   Parainfluenza Virus 4 NOT DETECTED NOT DETECTED   Respiratory Syncytial Virus NOT DETECTED NOT DETECTED   Bordetella pertussis NOT DETECTED NOT DETECTED   Chlamydophila pneumoniae NOT DETECTED NOT DETECTED   Mycoplasma pneumoniae NOT DETECTED NOT DETECTED    Comment: Performed at [facility], [address] [place], [place] [address]  Lactic acid, plasma     Status: Abnormal   Collection Time: 05/03/18  7:38 PM  Result Value Ref Range   Lactic Acid, Venous 2.6 (HH) 0.5 - 1.9 mmol/L    Comment: CRITICAL RESULT CALLED TO, READ BACK BY AND VERIFIED WITH MICHELLE LOFTON AT 2022 2018/05/03  SMA Performed at [facility], [address] [place], [place] [address]   Glucose, capillary     Status: Abnormal   Collection Time: 2018/05/03  8:21 PM  Result Value Ref Range   Glucose-Capillary 372 (H) 70 - 99 mg/dL  CBC with Differential/Platelet     Status: Abnormal   Collection Time: May 03, 2018  8:25 PM  Result Value Ref Range   WBC 15.7 (H) 4.0 - 10.5 K/uL   RBC 2.89 (L) 3.87 - 5.11 MIL/uL   Hemoglobin 8.2 (L) 12.0 - 15.0 g/dL   HCT 74.2 (L) 59.5 - 63.8 %   MCV 93.4 80.0 - 100.0 fL   MCH 28.4 26.0 - 34.0 pg   MCHC 30.4 30.0 - 36.0 g/dL   RDW 75.6 (H) 43.3 - 29.5 %   Platelets 76 (L) 150 - 400 K/uL    Comment: SPECIMEN CHECKED FOR CLOTS Immature Platelet Fraction may be clinically indicated, consider ordering this additional test [id number] CONSISTENT WITH PREVIOUS RESULT    nRBC 1.6 (H) 0.0 - 0.2 %   Neutrophils Relative % 69 %   Neutro Abs 10.9 (H) 1.7 - 7.7 K/uL   Lymphocytes Relative 13 %   Lymphs Abs 2.1 0.7 - 4.0 K/uL   Monocytes Relative 5 %   Monocytes Absolute 0.8 0.1 - 1.0 K/uL   Eosinophils Relative 1 %   Eosinophils Absolute 0.2 0.0 - 0.5 K/uL   Basophils Relative 1 %   Basophils Absolute 0.1 0.0 - 0.1 K/uL   Immature Granulocytes 11 %  Abs Immature Granulocytes 1.68 (H) 0.00 - 0.07 K/uL   Burr Cells PRESENT    Polychromasia PRESENT    Ovalocytes PRESENT     Comment: Performed at [facility], [address] [place], [place] [address]  Pathologist smear review     Status: None   Collection Time: 03/24/2018  8:25 PM  Result Value Ref Range   Path Review Peripheral blood smear is reviewed.     Comment: Patient presented with weakness and SOB. Found to have elevated troponin. Leukocytosis with neutrophilia and increase in immature granulocytes. Normocytic anemia with circulating nucleated RBCs. Schistocytes present. Hgb 8.4 to 6.9 since yesterday. Thrombocytopenia 85k to 68k since admission. Normal morphology. 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 Recommend additional studies including haptoglobin, ddimer, and fibrinogen. CHL secure text sent to Dr. Elisabeth Pigeon on April 29, 2018 at 11AM. Reviewed by Georgiann Cocker. Oneita Kras, M.D. Performed at [facility], [address] [place], [place] [address]   Heparin level (unfractionated)     Status: Abnormal   Collection Time: 03/23/2018  9:38 PM  Result Value Ref Range   Heparin Unfractionated <0.10 (L) 0.30 - 0.70 IU/mL    Comment: (NOTE) If heparin results are below expected values, and patient dosage has  been confirmed, suggest follow up testing of antithrombin III levels. Performed at [facility], [address] [place], [place] [address]   Troponin I - Now Then Q6H     Status: Abnormal   Collection Time: 04/05/2018 11:49 PM  Result Value Ref Range   Troponin I 3.74 (HH) <0.03 ng/mL    Comment: CRITICAL VALUE NOTED. VALUE IS CONSISTENT WITH PREVIOUSLY REPORTED/CALLED VALUE SMA Performed at [facility], [address] [place], [place] [address]   CBC     Status: Abnormal   Collection Time: 04/29/2018  4:09 AM  Result Value Ref Range   WBC 13.4 (H) 4.0 - 10.5 K/uL   RBC 2.50 (L) 3.87 - 5.11 MIL/uL   Hemoglobin 6.9 (L) 12.0 - 15.0 g/dL   HCT 54.6 (L) 27.0 - 35.0 %   MCV 88.4 80.0 - 100.0 fL   MCH 27.6 26.0 - 34.0 pg   MCHC 31.2 30.0 - 36.0 g/dL   RDW 09.3 (H) 81.8 - 29.9 %   Platelets 68 (L) 150 - 400 K/uL    Comment: Immature Platelet Fraction may be clinically indicated, consider ordering this additional test [id number]    nRBC 1.7 (H) 0.0 - 0.2 %    Comment: Performed at [facility], [address] [place], [place] [address]  Basic metabolic panel     Status: Abnormal   Collection Time: 04-29-2018  4:09 AM  Result Value Ref Range   Sodium 138 135 - 145 mmol/L   Potassium 2.6 (LL) 3.5 - 5.1 mmol/L    Comment: CRITICAL RESULT  CALLED TO, READ BACK BY AND VERIFIED WITH MICHELLE LOFTON AT 0537 04-29-2018 SMA    Chloride 109 98 - 111 mmol/L   CO2 14 (L) 22 - 32 mmol/L   Glucose, Bld 299 (H) 70 - 99 mg/dL   BUN 61 (H) 8 - 23 mg/dL   Creatinine, Ser 1.75 0.44 - 1.00 mg/dL   Calcium 7.5 (L) 8.9 - 10.3 mg/dL   GFR calc non Af Amer 57 (L) >60 mL/min   GFR calc Af Amer >60 >60 mL/min   Anion gap 15 5 - 15  Comment: Performed at [facility], [address] [place], [place] [address]  Troponin I - Now Then Q6H     Status: Abnormal   Collection Time: 03/19/2018  4:09 AM  Result Value Ref Range   Troponin I 3.70 (HH) <0.03 ng/mL    Comment: CRITICAL VALUE NOTED. VALUE IS CONSISTENT WITH PREVIOUSLY REPORTED/CALLED VALUE SMA Performed at [facility], [address] [place], [place] [address]   Ferritin     Status: Abnormal   Collection Time: 03/11/2018  4:09 AM  Result Value Ref Range   Ferritin 3,041 (H) 11 - 307 ng/mL    Comment: Performed at [facility], [address] [place], [place] [address]  Iron and TIBC     Status: Abnormal   Collection Time: 03/12/2018  4:09 AM  Result Value Ref Range   Iron 85 28 - 170 ug/dL   TIBC 914 (L) 782 - 956 ug/dL   Saturation Ratios 40 (H) 10.4 - 31.8 %   UIBC 125 ug/dL    Comment: Performed at [facility], [address] [place], [place] [address]  Reticulocytes     Status: Abnormal   Collection Time: 03/25/2018  4:09 AM  Result Value Ref Range   Retic Ct Pct 2.1 0.4 - 3.1 %   RBC. 2.50 (L) 3.87 - 5.11 MIL/uL   Retic Count, Absolute 51.8 19.0 - 186.0 K/uL   Immature Retic Fract 39.6 (H) 2.3 - 15.9 %    Comment: Performed at [facility], [address] [place], [place] [address]  Vitamin B12     Status: Abnormal   Collection Time: 03/14/2018  4:09 AM  Result Value Ref Range   Vitamin B-12 6,426 (H) 180 - 914 pg/mL    Comment: (NOTE) This assay is not validated for testing neonatal or myeloproliferative syndrome  specimens for Vitamin B12 levels. Performed at [facility], [address], [place], [place] [address]   Folate     Status: None   Collection Time: 03/15/2018  4:09 AM  Result Value Ref Range   Folate 7.1 >5.9 ng/mL    Comment: Performed at [facility], [address] [place], [place] [address]  Hemoglobin A1c     Status: Abnormal   Collection Time: 03/20/2018  4:09 AM  Result Value Ref Range   Hgb A1c MFr Bld 7.7 (H) 4.8 - 5.6 %    Comment: (NOTE) Pre diabetes:          5.7%-6.4% Diabetes:              >6.4% Glycemic control for   <7.0% adults with diabetes    Mean Plasma Glucose 174.29 mg/dL    Comment: Performed at [facility], [address], [place], [place] [address]  Lipid panel     Status: Abnormal   Collection Time: 03/31/2018  4:09 AM  Result Value Ref Range   Cholesterol 138 0 - 200 mg/dL   Triglycerides 102 (H) <150 mg/dL   HDL 23 (L) >72 mg/dL   Total CHOL/HDL Ratio 6.0 RATIO   VLDL 54 (H) 0 - 40 mg/dL   LDL Cholesterol 61 0 - 99 mg/dL    Comment:        Total Cholesterol/HDL:CHD Risk Coronary Heart Disease Risk Table                     Men   Women  1/2 Average Risk   3.4  3.3  Average Risk       5.0   4.4  2 X Average Risk   9.6   7.1  3 X Average Risk  23.4   11.0        Use the calculated Patient Ratio above and the CHD Risk Table to determine the patient's CHD Risk.        ATP III CLASSIFICATION (LDL):  <100     mg/dL   Optimal  914-782  mg/dL   Near or Above                    Optimal  130-159  mg/dL   Borderline  956-213  mg/dL   High  >086     mg/dL   Very High Performed at [facility], [address] [place], [place] [address]   Glucose, capillary     Status: Abnormal   Collection Time: April 19, 2018  8:06 AM  Result Value Ref Range   Glucose-Capillary 255 (H) 70 - 99 mg/dL   Comment 1 Notify RN    Comment 2 Document in Chart   Heparin level (unfractionated)     Status: Abnormal   Collection Time: 04/19/18   8:16 AM  Result Value Ref Range   Heparin Unfractionated 0.15 (L) 0.30 - 0.70 IU/mL    Comment: (NOTE) If heparin results are below expected values, and patient dosage has  been confirmed, suggest follow up testing of antithrombin III levels. Performed at [facility], [address] [place], [place] [address]   Magnesium     Status: None   Collection Time: 2018/04/19  8:16 AM  Result Value Ref Range   Magnesium 2.2 1.7 - 2.4 mg/dL    Comment: Performed at [facility], [address] [place], [place] [address]  Prepare RBC     Status: None   Collection Time: April 19, 2018  9:00 AM  Result Value Ref Range   Order Confirmation      ORDER PROCESSED BY BLOOD BANK Performed at [facility], [address] [place], [place] [address]   Type and screen [facility]     Status: None (Preliminary result)   Collection Time: 04-19-18  9:47 AM  Result Value Ref Range   ABO/RH(D) O POS    Antibody Screen NEG    Sample Expiration 04/11/2018    Unit Number [id number]    Blood Component Type RBC LR PHER2    Unit division 00    Status of Unit ISSUED    Transfusion Status OK TO TRANSFUSE    Crossmatch Result      Compatible Performed at [facility], [address] [place], [place] [address]   Glucose, capillary     Status: Abnormal   Collection Time: April 19, 2018 12:14 PM  Result Value Ref Range   Glucose-Capillary 240 (H) 70 - 99 mg/dL   Comment 1 Notify RN    Comment 2 Document in Chart   Glucose, capillary     Status: Abnormal   Collection Time: 04/19/2018  3:18 PM  Result Value Ref Range   Glucose-Capillary 275 (H) 70 - 99 mg/dL    Ct Angio Chest Pe W Or Wo Contrast  Result Date: 03/29/2018 CLINICAL DATA:  Generalized weakness. LEFT lower extremity edema. EXAM: CT ANGIOGRAPHY CHEST WITH CONTRAST TECHNIQUE: Multidetector CT imaging of the chest was performed using the standard protocol during bolus administration of intravenous  contrast. Multiplanar CT image reconstructions and MIPs were  obtained to evaluate the vascular anatomy. CONTRAST:  60mL OMNIPAQUE IOHEXOL 350 MG/ML SOLN COMPARISON:  Chest radiograph earlier today. FINDINGS: Considerable respiratory motion decreases sensitivity and specificity. Overall study is grossly diagnostic. Cardiovascular: Satisfactory opacification of the pulmonary arteries to the segmental level. No evidence of pulmonary embolism. Cardiomegaly. No pericardial effusion. Aortic atherosclerosis. Mediastinum/Nodes: No enlarged mediastinal, hilar, or axillary lymph nodes. Thyroid gland, trachea, and esophagus demonstrate no significant findings. Lungs/Pleura: Mild dependent edema, and small BILATERAL effusions, greater on the LEFT. Mild LEFT basilar atelectasis. Upper Abdomen: No acute abnormality. Musculoskeletal: Unremarkable. Review of the MIP images confirms the above findings. IMPRESSION: 1. No evidence for pulmonary emboli. 2. Mild dependent edema, and small BILATERAL effusions, greater on the LEFT. Aortic Atherosclerosis (ICD10-I70.0). Electronically Signed   By: Elsie Stain M.D.   On: May 07, 2018 18:29   US Venous Img Lower Unilateral Left  Result Date: 2018-05-07 CLINICAL DATA:  LEFT leg swelling. EXAM: LEFT LOWER EXTREMITY VENOUS DOPPLER ULTRASOUND TECHNIQUE: Gray-scale sonography with graded compression, as well as color Doppler and duplex ultrasound were performed to evaluate the lower extremity deep venous systems from the level of the common femoral vein and including the common femoral, femoral, profunda femoral, popliteal and calf veins including the posterior tibial, peroneal and gastrocnemius veins when visible. The superficial great saphenous vein was also interrogated. Spectral Doppler was utilized to evaluate flow at rest and with distal augmentation maneuvers in the common femoral, femoral and popliteal veins. COMPARISON:  Chest CT earlier today demonstrates no evidence of pulmonary  emboli. FINDINGS: Contralateral Common Femoral Vein: Respiratory phasicity is normal and symmetric with the symptomatic side. No evidence of thrombus. Normal compressibility. Common Femoral Vein: No evidence of thrombus. Normal compressibility, respiratory phasicity and response to augmentation. Saphenofemoral Junction: No evidence of thrombus. Normal compressibility and flow on color Doppler imaging. Profunda Femoral Vein: No evidence of thrombus. Normal compressibility and flow on color Doppler imaging. Femoral Vein: No evidence of thrombus. Normal compressibility, respiratory phasicity and response to augmentation. Popliteal Vein: No evidence of thrombus. Normal compressibility, respiratory phasicity and response to augmentation. Calf Veins: Poorly visualized calf veins. Superficial Great Saphenous Vein: No evidence of thrombus. Normal compressibility. Venous Reflux:  None. Other Findings:  None. IMPRESSION: No evidence of LEFT lower extremity deep venous thrombosis. Electronically Signed   By: Elsie Stain M.D.   On: 05/07/18 19:07   Dg Chest Portable 1 View  Result Date: 05-07-18 CLINICAL DATA:  Three-week history weakness.  Hypoxia. EXAM: PORTABLE CHEST 1 VIEW COMPARISON:  None. FINDINGS: The heart is enlarged. There is no edema or effusion. The visualized soft tissues and bony thorax are unremarkable. IMPRESSION: Cardiomegaly without failure. Electronically Signed   By: Marin Roberts M.D.   On: 05-07-18 14:37    Review of Systems  Constitutional: Positive for diaphoresis, malaise/fatigue and weight loss.  HENT: Positive for congestion and hearing loss.   Eyes: Negative.   Respiratory: Positive for cough and shortness of breath.   Cardiovascular: Positive for orthopnea, claudication, leg swelling and PND.  Gastrointestinal: Negative.   Genitourinary: Negative.   Musculoskeletal: Positive for myalgias.  Skin: Negative.   Neurological: Positive for dizziness and weakness.   Endo/Heme/Allergies: Negative.   Psychiatric/Behavioral: Negative.    Blood pressure (!) 92/54, pulse 98, temperature 98.5 F (36.9 C), temperature source Oral, resp. rate (!) 26, height  (1.6 m), weight 59 kg, SpO2 98 %. Physical Exam  Nursing note and vitals reviewed. Constitutional: She is oriented to person, place, and time. She appears  well-developed and well-nourished.  HENT:  Head: Normocephalic and atraumatic.  Eyes: Pupils are equal, round, and reactive to light. Conjunctivae and EOM are normal.  Neck: Normal range of motion. Neck supple.  Cardiovascular: Normal rate and regular rhythm.  Murmur heard. Respiratory: Effort normal and breath sounds normal.  GI: Soft. Bowel sounds are normal.  Musculoskeletal: Normal range of motion.        General: Edema present.  Neurological: She is alert and oriented to person, place, and time. She has normal reflexes.  Skin: Skin is warm and dry.  Psychiatric: She has a normal mood and affect.    Assessment/Plan: Elevated troponins Non-STEMI Profound anemia Hypokalemia Hypotension Cardiomyopathy Elevated lactic acid Renal insufficiency Thyroid disease Diabetes . Plan Agree admit to telemetry Follow-up troponins and EKGs Recommend transfuse to above 8 or 9 Correct electrolytes especially hypokalemia Fluid resuscitate gently because of hypotension Echocardiogram suggests severe cardiomyopathy less than 25% globally We will hopefully be able to start ACE inhibitor beta-blocker diuretics Consider nephrology input for renal insufficiency May need GI involvement for profound anemia patient may need scope Will consider cardiac cath but will defer for now until patient is more stable transfused and electrolytes are normalized  Dwayne D Callwood 03/22/2018, 5:06 PM

## 2018-04-09 NOTE — Progress Notes (Signed)
Chaplain provided spiritual care support during Codes between 430 and eventual death. Chaplain called family during third code to inform family of significant decline and encourage family to come to hospital. Son and another family member arrived and chaplain escorted to room. Chaplain offered space for remembrance. Family was thankful for care which appeared to bring them comfort. Chaplain offered prayer. Family provided funeral home - [facility] [place], [place] [phone]). Chaplain escorted family out of hospital.     04/06/2018 2100  Clinical Encounter Type  Visited With Patient;Family;Health care provider  Visit Type Follow-up;Code;Death  Referral From Nurse

## 2018-04-09 NOTE — Progress Notes (Signed)
PT Cancellation Note  Patient Details Name: Stacy Duran MRN: [id number] DOB: 07-11-1945   Cancelled Treatment:    Reason Eval/Treat Not Completed: Patient not medically ready  Will hold PT at this time secondary to multiple lab values and vitals that are not appropriate.  Hgb , K+, BP are low and troponins are still trending high.   Malachi Pro, DPT 2018/04/19, 8:31 AM

## 2018-04-09 NOTE — Progress Notes (Signed)
*  PRELIMINARY RESULTS* Echocardiogram 2D Echocardiogram has been performed.  Stacy Duran 03/25/2018, 8:53 AM

## 2018-04-09 NOTE — Consult Note (Signed)
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  DIC and TTP also or in the differential.  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  Since evaluation patient coded a total of 3 times requiring intubation.  During the third code patient was unresponsive and showed sign of severe anoxic injury.  She was pronounced dead at 7:38 PM.

## 2018-04-09 NOTE — Progress Notes (Signed)
Dr. Clemmie Krill was notified about patient's potassium level.  Lab called and it is 2.6  Orders noted.  Will continue to observe patient closely.

## 2018-04-09 NOTE — Consult Note (Signed)
Pharmacy Antibiotic Note  Stacy Duran is a 73 y.o. female admitted on 05-07-2018 with sepsis.  Pharmacy has been consulted for Vancomycin dosing.  Plan: Will start Vancomycin bolus of 1500 mg x1, then start maintenance dose tomorrow.  Maintenance dose: Vancomycin 750 IV every 24 hours. Goal AUC 400-550.   Height: 5\' 3"  (160 cm) Weight: 130 lb (59 kg) IBW/kg (Calculated) : 52.4  Scr used 0.98 (baseline: 1.26 --May 07, 2018) Expected AUC: 446.9   Temp (24hrs), Avg:97.9 F (36.6 C), Min:96.7 F (35.9 C), Max:98.5 F (36.9 C)  Recent Labs  Lab 05/07/2018 1157 2018-05-07 1417 05/07/2018 1541 05/07/2018 1938 07-May-2018 2025 03/15/2018 0409  WBC 15.4*  --   --   --  15.7* 13.4*  CREATININE 1.26*  --   --   --   --  0.98  LATICACIDVEN  --  2.0* 2.9* 2.6*  --   --     Estimated Creatinine Clearance: 42.3 mL/min (by C-G formula based on SCr of 0.98 mg/dL).    Allergies  Allergen Reactions  . Latex Rash    Antimicrobials this admission: Vancomycin 3/31>>  Azithromycin 3/31 >>  Cefepime 3/31 >> Metronidazole 3/31 >>  Dose adjustments this admission: N/A  Microbiology results: BCx: pending  MRSA PCR: pending  Thank you for allowing pharmacy to be a part of this patient's care.  Katha Cabal, PharmD 04/02/2018 5:56 PM

## 2018-04-09 NOTE — Progress Notes (Signed)
[employer] [facility] [place] [facility]   PATIENT NAME: Stacy Duran    MR#:  [id number]  DATE OF BIRTH:  13-Dec-1945  SUBJECTIVE:  CHIEF COMPLAINT:   Chief Complaint  Patient presents with  . Weakness  . Anorexia   Have long term weakness and worsening complains, no fever or sick contacts.  Noted High troponin and started on heparine drip. Today have low Hb and low platelets. Noted possible hemolysis on pathology review.  REVIEW OF SYSTEMS:  CONSTITUTIONAL: No fever,have fatigue or weakness.  EYES: No blurred or double vision.  EARS, NOSE, AND THROAT: No tinnitus or ear pain.  RESPIRATORY: No cough, shortness of breath, wheezing or hemoptysis.  CARDIOVASCULAR: No chest pain, orthopnea, edema.  GASTROINTESTINAL: No nausea, vomiting, diarrhea or abdominal pain.  GENITOURINARY: No dysuria, hematuria.  ENDOCRINE: No polyuria, nocturia,  HEMATOLOGY: No anemia, easy bruising or bleeding SKIN: No rash or lesion. MUSCULOSKELETAL: No joint pain or arthritis.   NEUROLOGIC: No tingling, numbness, weakness.  PSYCHIATRY: No anxiety or depression.   ROS  DRUG ALLERGIES:   Allergies  Allergen Reactions  . Latex Rash    VITALS:  Blood pressure (!) 92/54, pulse 98, temperature 98.5 F (36.9 C), temperature source Oral, resp. rate (!) 26, height  (1.6 m), weight 59 kg, SpO2 98 %.  PHYSICAL EXAMINATION:  GENERAL:  73 y.o.-year-old patient lying in the bed with no acute distress.  EYES: Pupils equal, round, reactive to light and accommodation. No scleral icterus. Extraocular muscles intact. Very pale. HEENT: Head atraumatic, normocephalic. Oropharynx and nasopharynx clear.  NECK:  Supple, no jugular venous distention. No thyroid enlargement, no tenderness.  LUNGS: Normal breath sounds bilaterally, no wheezing, no crepitation. No use of accessory muscles of respiration.  CARDIOVASCULAR: S1, S2 normal. No murmurs, rubs, or gallops.  ABDOMEN: Soft, nontender,  nondistended. Bowel sounds present. No organomegaly or mass.  EXTREMITIES: No pedal edema, cyanosis, or clubbing.  NEUROLOGIC: Cranial nerves II through XII are intact. Muscle strength 3-4/5 in all extremities. Sensation intact. Gait not checked.  PSYCHIATRIC: The patient is alert and oriented x 2.  SKIN: No obvious rash, lesion, or ulcer.   Physical Exam LABORATORY PANEL:   CBC Recent Labs  Lab 05/07/2018 0409  WBC 13.4*  HGB 6.9*  HCT 22.1*  PLT 68*   ------------------------------------------------------------------------------------------------------------------  Chemistries  Recent Labs  Lab 03/26/2018 1837 2018-05-07 0409 May 07, 2018 0816  NA  --  138  --   K  --  2.6*  --   CL  --  109  --   CO2  --  14*  --   GLUCOSE  --  299*  --   BUN  --  61*  --   CREATININE  --  0.98  --   CALCIUM  --  7.5*  --   MG  --   --  2.2  AST 27  --   --   ALT 13  --   --   ALKPHOS 1,000*  --   --   BILITOT 0.6  --   --    ------------------------------------------------------------------------------------------------------------------  Cardiac Enzymes Recent Labs  Lab 04/04/2018 2349 05/07/2018 0409  TROPONINI 3.74* 3.70*   ------------------------------------------------------------------------------------------------------------------  RADIOLOGY:  Ct Angio Chest Pe W Or Wo Contrast  Result Date: 03/24/2018 CLINICAL DATA:  Generalized weakness. LEFT lower extremity edema. EXAM: CT ANGIOGRAPHY CHEST WITH CONTRAST TECHNIQUE: Multidetector CT imaging of the chest was performed using the standard protocol during bolus administration of intravenous contrast. Multiplanar  CT image reconstructions and MIPs were obtained to evaluate the vascular anatomy. CONTRAST:  31mL OMNIPAQUE IOHEXOL 350 MG/ML SOLN COMPARISON:  Chest radiograph earlier today. FINDINGS: Considerable respiratory motion decreases sensitivity and specificity. Overall study is grossly diagnostic. Cardiovascular: Satisfactory  opacification of the pulmonary arteries to the segmental level. No evidence of pulmonary embolism. Cardiomegaly. No pericardial effusion. Aortic atherosclerosis. Mediastinum/Nodes: No enlarged mediastinal, hilar, or axillary lymph nodes. Thyroid gland, trachea, and esophagus demonstrate no significant findings. Lungs/Pleura: Mild dependent edema, and small BILATERAL effusions, greater on the LEFT. Mild LEFT basilar atelectasis. Upper Abdomen: No acute abnormality. Musculoskeletal: Unremarkable. Review of the MIP images confirms the above findings. IMPRESSION: 1. No evidence for pulmonary emboli. 2. Mild dependent edema, and small BILATERAL effusions, greater on the LEFT. Aortic Atherosclerosis (ICD10-I70.0). Electronically Signed   By: Elsie Stain M.D.   On: 2018/04/09 18:29   US Venous Img Lower Unilateral Left  Result Date: 2018-04-09 CLINICAL DATA:  LEFT leg swelling. EXAM: LEFT LOWER EXTREMITY VENOUS DOPPLER ULTRASOUND TECHNIQUE: Gray-scale sonography with graded compression, as well as color Doppler and duplex ultrasound were performed to evaluate the lower extremity deep venous systems from the level of the common femoral vein and including the common femoral, femoral, profunda femoral, popliteal and calf veins including the posterior tibial, peroneal and gastrocnemius veins when visible. The superficial great saphenous vein was also interrogated. Spectral Doppler was utilized to evaluate flow at rest and with distal augmentation maneuvers in the common femoral, femoral and popliteal veins. COMPARISON:  Chest CT earlier today demonstrates no evidence of pulmonary emboli. FINDINGS: Contralateral Common Femoral Vein: Respiratory phasicity is normal and symmetric with the symptomatic side. No evidence of thrombus. Normal compressibility. Common Femoral Vein: No evidence of thrombus. Normal compressibility, respiratory phasicity and response to augmentation. Saphenofemoral Junction: No evidence of thrombus.  Normal compressibility and flow on color Doppler imaging. Profunda Femoral Vein: No evidence of thrombus. Normal compressibility and flow on color Doppler imaging. Femoral Vein: No evidence of thrombus. Normal compressibility, respiratory phasicity and response to augmentation. Popliteal Vein: No evidence of thrombus. Normal compressibility, respiratory phasicity and response to augmentation. Calf Veins: Poorly visualized calf veins. Superficial Great Saphenous Vein: No evidence of thrombus. Normal compressibility. Venous Reflux:  None. Other Findings:  None. IMPRESSION: No evidence of LEFT lower extremity deep venous thrombosis. Electronically Signed   By: Elsie Stain M.D.   On: 04/09/18 19:07   Dg Chest Portable 1 View  Result Date: 2018-04-09 CLINICAL DATA:  Three-week history weakness.  Hypoxia. EXAM: PORTABLE CHEST 1 VIEW COMPARISON:  None. FINDINGS: The heart is enlarged. There is no edema or effusion. The visualized soft tissues and bony thorax are unremarkable. IMPRESSION: Cardiomegaly without failure. Electronically Signed   By: Marin Roberts M.D.   On: 04/09/18 14:37    ASSESSMENT AND PLAN:   Active Problems:   Elevated troponin  * Elevated troponin- due to NSTEMI , demand ischemia. Troponin 3.26.  EKG without any signs of ischemia.  Patient does have a history of an MI.  She denies any active chest pain. - Started on heparin drip [facility] consult -Check ECHO -Start aspirin, beta blocker, lipitor -Checked hemoglobin A1c and lipid panel - Due to drop in Hb and Platelet- heparine stopped.  *Anemia As per pathologist and review of peripheral smear it seems like hemolysis. I have spoken to hematologist Dr. Orlie Dakin, he had ordered some more labs and agreed to blood transfusion. Stop heparin drip for now.  Discussed with cardiologist, hematologist.  *  Shortness of breath-   No history of lung disease. CXR negative. Concern for DVT/PE with LLE edema and patient  staying in the bed for the last couple of days.  No recent travel, mostly stays in the home and no fevers or cough, so low suspicion for COVID. -Negative left lower extremity doppler ultrasound -CTA chest negative for any abnormalities. -RVP-negative.  * Lactic acidosis- unclear etiology.  She does have a leukocytosis, but is not meeting sepsis criteria on admission and does not have any signs of an active infection.  UA and chest x-ray are negative for infection. -IV fluids -Trend lactic acid -CTA chest negative. -Due to her chronically worsening complaints and have episodes of chills and generalized weakness, I suspect there could be endocarditis or underlying malignancy.  I will get blood cultures.  *Metabolic acidosis Patient has lactic acidosis and borderline low blood pressures with bicarb running low now. She is at high risk of worsening, I spoke to patient's son and informed him about this critical condition including hemolysis ,possibility of myocardial infarction.  I will transfer to stepdown unit for close monitoring.  Hypokalemia- K 2.2.  Magnesium normal. -Replete and recheck -Potassium is still low, will replace further.  AKI versus CKD- creatinine 1.26.  Unknown baseline. -IV fluids -Avoid nephrotoxic agents -Recheck creatinine in the morning is normal.  Hyperglycemia-blood sugars in the 300s on admission.  No history of diabetes. -Check A1c -SSI  All the records are reviewed and case discussed with Care Management/Social Workerr. Management plans discussed with the patient, family and they are in agreement.  CODE STATUS: Full.  TOTAL TIME TAKING CARE OF THIS PATIENT: 45 critical care minutes.   Spoke to patient's son on phone to discuss findings and condition and further work-up.  POSSIBLE D/C IN 2-3 DAYS, DEPENDING ON CLINICAL CONDITION.   Altamese Dilling M.D on May 01, 2018   Between 7am to 6pm - Pager - [phone]  After 6pm go to [email or web address]  - password [email or web address]  [facility]  Office  [phone]  CC: Primary care physician; System, Pcp Not In  Note: This dictation was prepared with Dragon dictation along with smaller phrase technology. Any transcriptional errors that result from this process are unintentional.

## 2018-04-09 DEATH — deceased

## 2018-04-10 LAB — HAPTOGLOBIN: Haptoglobin: 271 mg/dL (ref 42–346)

## 2018-04-10 LAB — HEPARIN INDUCED PLATELET AB (HIT ANTIBODY): Heparin Induced Plt Ab: 0.077 OD (ref 0.000–0.400)

## 2018-04-12 LAB — NOVEL CORONAVIRUS, NAA (HOSP ORDER, SEND-OUT TO REF LAB; TAT 18-24 HRS): SARS-CoV-2, NAA: NOT DETECTED

## 2018-05-09 NOTE — Discharge Summary (Signed)
DISCHARGE SUMMARY    Patient Details  Name: Stacy Duran MRN: [id number] DOB: 1945/09/07  Admission/Discharge Information   Admit Date:  April 11, 2018  Date of Death:  12-Apr-2018  Time of Death:  1938  Length of Stay: 1  Referring Physician: System, Pcp Not In   Reason(s) for Hospitalization  Elevated Troponin  Diagnoses  Preliminary cause of death:  Secondary Diagnoses (including complications and co-morbidities):  Active Problems:   Elevated troponin NSTEMI Acute Hypoxic Respiratory Failure Metabolic Acidosis secondary to Lactic Acidosis AKI on CKD Anemia Hyperkalemia  Brief Hospital Course (including significant findings, care, treatment, and services provided and events leading to death)  Stacy Duran is a 73 y.o. year old female who presented to [facility] ED on Apr 11, 2018 with complaints of Weakness and anorexia for several days.  She endorsed body aches, and spending more time in bed due to weakness.  She also endorsed mild shortness of breath exacerbated with exertion, denied chest pain.  Initial workup in the ED revealed tachycardia, potassium 2.2, Creatinine 1.26, WBC 15.4, Hemoglobin 8.4, Lactic acid 2.0, and troponin 3.26.  Urinalysis was unremarkable and CXR showing cardiomegaly but without any acute pulmonary abnormalities.  She was placed on heparin drip and admitted to [facility] unit for treatment of elevated troponin, Acute hypoxic respiratory failure, lactic acidosis, AKI, and Hypokalemia.  On 2018-04-12, she was noted to have worsening anemia with concern for possible hemolysis on pathology review, and thrombocytopenia of which Hematology was consulted.  Heparin drip was stopped. Pt also with worsening lactic acidosis and hypotension requiring transfer to ICU.  Shortly after arrival to ICU at 1547, she was noted to have PEA cardiac arrest.  CPR and ACLS was initiated, of which she received 2 rounds of epi and 1 amp of Bicarb with ROCS obtained.  She was intubated  during the code, and follow up ABG showed severe metabolic acidosis with pH 6.9.  She was on maximum support with Levophed, Vasopressin, and Epinephrine drips.  Despite maximum support, she had another PEA arrest at 1848, of which she received multiple rounds of epi, Calcium, Bicarb, magnesium, and emergent blood.  ROSC was obtained after approximately 18 minutes of ACLS.   Pt had one final episode of PEA arrest at 1920 of which she again received multiple rounds of CPR and ACLS with epi, calcium, and bicarb.  Pt was unresponsive and showed signs of severe anoxic injury.  Code was called and time of death pronounced at 1938.      Pertinent Labs and Studies  Significant Diagnostic Studies  ImagingResults  Ct Angio Chest Pe W Or Wo Contrast  Result Date: 2018/04/11 CLINICAL DATA:  Generalized weakness. LEFT lower extremity edema. EXAM: CT ANGIOGRAPHY CHEST WITH CONTRAST TECHNIQUE: Multidetector CT imaging of the chest was performed using the standard protocol during bolus administration of intravenous contrast. Multiplanar CT image reconstructions and MIPs were obtained to evaluate the vascular anatomy. CONTRAST:  81mL OMNIPAQUE IOHEXOL 350 MG/ML SOLN COMPARISON:  Chest radiograph earlier today. FINDINGS: Considerable respiratory motion decreases sensitivity and specificity. Overall study is grossly diagnostic. Cardiovascular: Satisfactory opacification of the pulmonary arteries to the segmental level. No evidence of pulmonary embolism. Cardiomegaly. No pericardial effusion. Aortic atherosclerosis. Mediastinum/Nodes: No enlarged mediastinal, hilar, or axillary lymph nodes. Thyroid gland, trachea, and esophagus demonstrate no significant findings. Lungs/Pleura: Mild dependent edema, and small BILATERAL effusions, greater on the LEFT. Mild LEFT basilar atelectasis. Upper Abdomen: No acute abnormality. Musculoskeletal: Unremarkable. Review of the MIP images confirms the above findings. IMPRESSION: 1. No  evidence for pulmonary emboli. 2. Mild dependent edema, and small BILATERAL effusions, greater on the LEFT. Aortic Atherosclerosis (ICD10-I70.0). Electronically Signed   By: Elsie Stain M.D.   On: 04/05/2018 18:29   US Venous Img Lower Unilateral Left  Result Date: 04/06/2018 CLINICAL DATA:  LEFT leg swelling. EXAM: LEFT LOWER EXTREMITY VENOUS DOPPLER ULTRASOUND TECHNIQUE: Gray-scale sonography with graded compression, as well as color Doppler and duplex ultrasound were performed to evaluate the lower extremity deep venous systems from the level of the common femoral vein and including the common femoral, femoral, profunda femoral, popliteal and calf veins including the posterior tibial, peroneal and gastrocnemius veins when visible. The superficial great saphenous vein was also interrogated. Spectral Doppler was utilized to evaluate flow at rest and with distal augmentation maneuvers in the common femoral, femoral and popliteal veins. COMPARISON:  Chest CT earlier today demonstrates no evidence of pulmonary emboli. FINDINGS: Contralateral Common Femoral Vein: Respiratory phasicity is normal and symmetric with the symptomatic side. No evidence of thrombus. Normal compressibility. Common Femoral Vein: No evidence of thrombus. Normal compressibility, respiratory phasicity and response to augmentation. Saphenofemoral Junction: No evidence of thrombus. Normal compressibility and flow on color Doppler imaging. Profunda Femoral Vein: No evidence of thrombus. Normal compressibility and flow on color Doppler imaging. Femoral Vein: No evidence of thrombus. Normal compressibility, respiratory phasicity and response to augmentation. Popliteal Vein: No evidence of thrombus. Normal compressibility, respiratory phasicity and response to augmentation. Calf Veins: Poorly visualized calf veins. Superficial Great Saphenous Vein: No evidence of thrombus. Normal compressibility. Venous Reflux:  None. Other Findings:  None.  IMPRESSION: No evidence of LEFT lower extremity deep venous thrombosis. Electronically Signed   By: Elsie Stain M.D.   On: 03/14/2018 19:07   Dg Chest Portable 1 View  Result Date: 03/21/2018 CLINICAL DATA:  Three-week history weakness.  Hypoxia. EXAM: PORTABLE CHEST 1 VIEW COMPARISON:  None. FINDINGS: The heart is enlarged. There is no edema or effusion. The visualized soft tissues and bony thorax are unremarkable. IMPRESSION: Cardiomegaly without failure. Electronically Signed   By: Marin Roberts M.D.   On: 03/18/2018 14:37     Microbiology        Recent Results (from the past 240 hour(s))  Respiratory Panel by PCR     Status: None   Collection Time: 03/09/2018  6:38 PM  Result Value Ref Range Status   Adenovirus NOT DETECTED NOT DETECTED Final   Coronavirus 229E NOT DETECTED NOT DETECTED Final    Comment: (NOTE) The Coronavirus on the Respiratory Panel, DOES NOT test for the novel  Coronavirus (2019 nCoV)    Coronavirus HKU1 NOT DETECTED NOT DETECTED Final   Coronavirus NL63 NOT DETECTED NOT DETECTED Final   Coronavirus OC43 NOT DETECTED NOT DETECTED Final   Metapneumovirus NOT DETECTED NOT DETECTED Final   Rhinovirus / Enterovirus NOT DETECTED NOT DETECTED Final   Influenza A NOT DETECTED NOT DETECTED Final   Influenza B NOT DETECTED NOT DETECTED Final   Parainfluenza Virus 1 NOT DETECTED NOT DETECTED Final   Parainfluenza Virus 2 NOT DETECTED NOT DETECTED Final   Parainfluenza Virus 3 NOT DETECTED NOT DETECTED Final   Parainfluenza Virus 4 NOT DETECTED NOT DETECTED Final   Respiratory Syncytial Virus NOT DETECTED NOT DETECTED Final   Bordetella pertussis NOT DETECTED NOT DETECTED Final   Chlamydophila pneumoniae NOT DETECTED NOT DETECTED Final   Mycoplasma pneumoniae NOT DETECTED NOT DETECTED Final    Comment: Performed at [facility], [address], [place], [place]  [address]    Lab Basic Metabolic Panel: [facility]          Recent Labs  Lab 04/03/2018 1157 03/27/2018 1417 May 07, 2018 0409 2018-05-07 0816 May 07, 2018 1832  NA 135  --  138  --  134*  K 2.2*  --  2.6*  --  6.7*  CL 105  --  109  --  108  CO2 15*  --  14*  --  12*  GLUCOSE 372*  --  299*  --  518*  BUN 65*  --  61*  --  58*  CREATININE 1.26*  --  0.98  --  1.41*  CALCIUM 7.7*  --  7.5*  --  7.0*  MG  --  2.4  --  2.2  --   PHOS  --   --   --   --  7.1*     Liver Function Tests: [facility]      Recent Labs  Lab 03/15/2018 1837 05/07/18 1832  AST 27 108*  ALT 13 32  ALKPHOS 1,000* 891*  BILITOT 0.6 0.7  PROT 5.2* 4.3*  ALBUMIN 2.1* 1.6*     [facility]  No results for input(s): LIPASE, AMYLASE in the last 168 hours.   [facility]  No results for input(s): AMMONIA in the last 168 hours.   CBC: [facility]        Recent Labs  Lab 03/10/2018 1157 04/04/2018 2025 May 07, 2018 0409 05/07/2018 1832  WBC 15.4* 15.7* 13.4* 33.3*  NEUTROABS  --  10.9*  --   --   HGB 8.4* 8.2* 6.9* 8.6*  8.7*  HCT 26.8* 27.0* 22.1* 30.1*  MCV 87.9 93.4 88.4 97.7  PLT 85* 76* 68* 84*     Cardiac Enzymes: [facility]        Recent Labs  Lab 03/31/2018 1417 03/19/2018 1837 03/12/2018 2349 May 07, 2018 0409  TROPONINI 3.26* 2.91* 3.74* 3.70*     Sepsis Labs: [facility]  Recent Labs  Lab 03/16/2018 1157 03/15/2018 1417 03/29/2018 1541 03/11/2018 1938 03/30/2018 2025 2018-05-07 0409 05/07/18 1832  WBC 15.4*  --   --   --  15.7* 13.4* 33.3*  LATICACIDVEN  --  2.0* 2.9* 2.6*  --   --   --       Procedures/Operations  Inbutation 05/07/2018 Femoral CVC 05-07-18    Vida Rigger, M.D.  Pulmonary & Critical Care Medicine  [facility]

## 2020-03-07 IMAGING — DX PORTABLE CHEST - 1 VIEW
1 series · 1 of 1 positions shown · non-contrast
Comparison: None.

CLINICAL DATA: Three-week history weakness.  Hypoxia.

EXAM:
PORTABLE CHEST 1 VIEW

[chest ap]
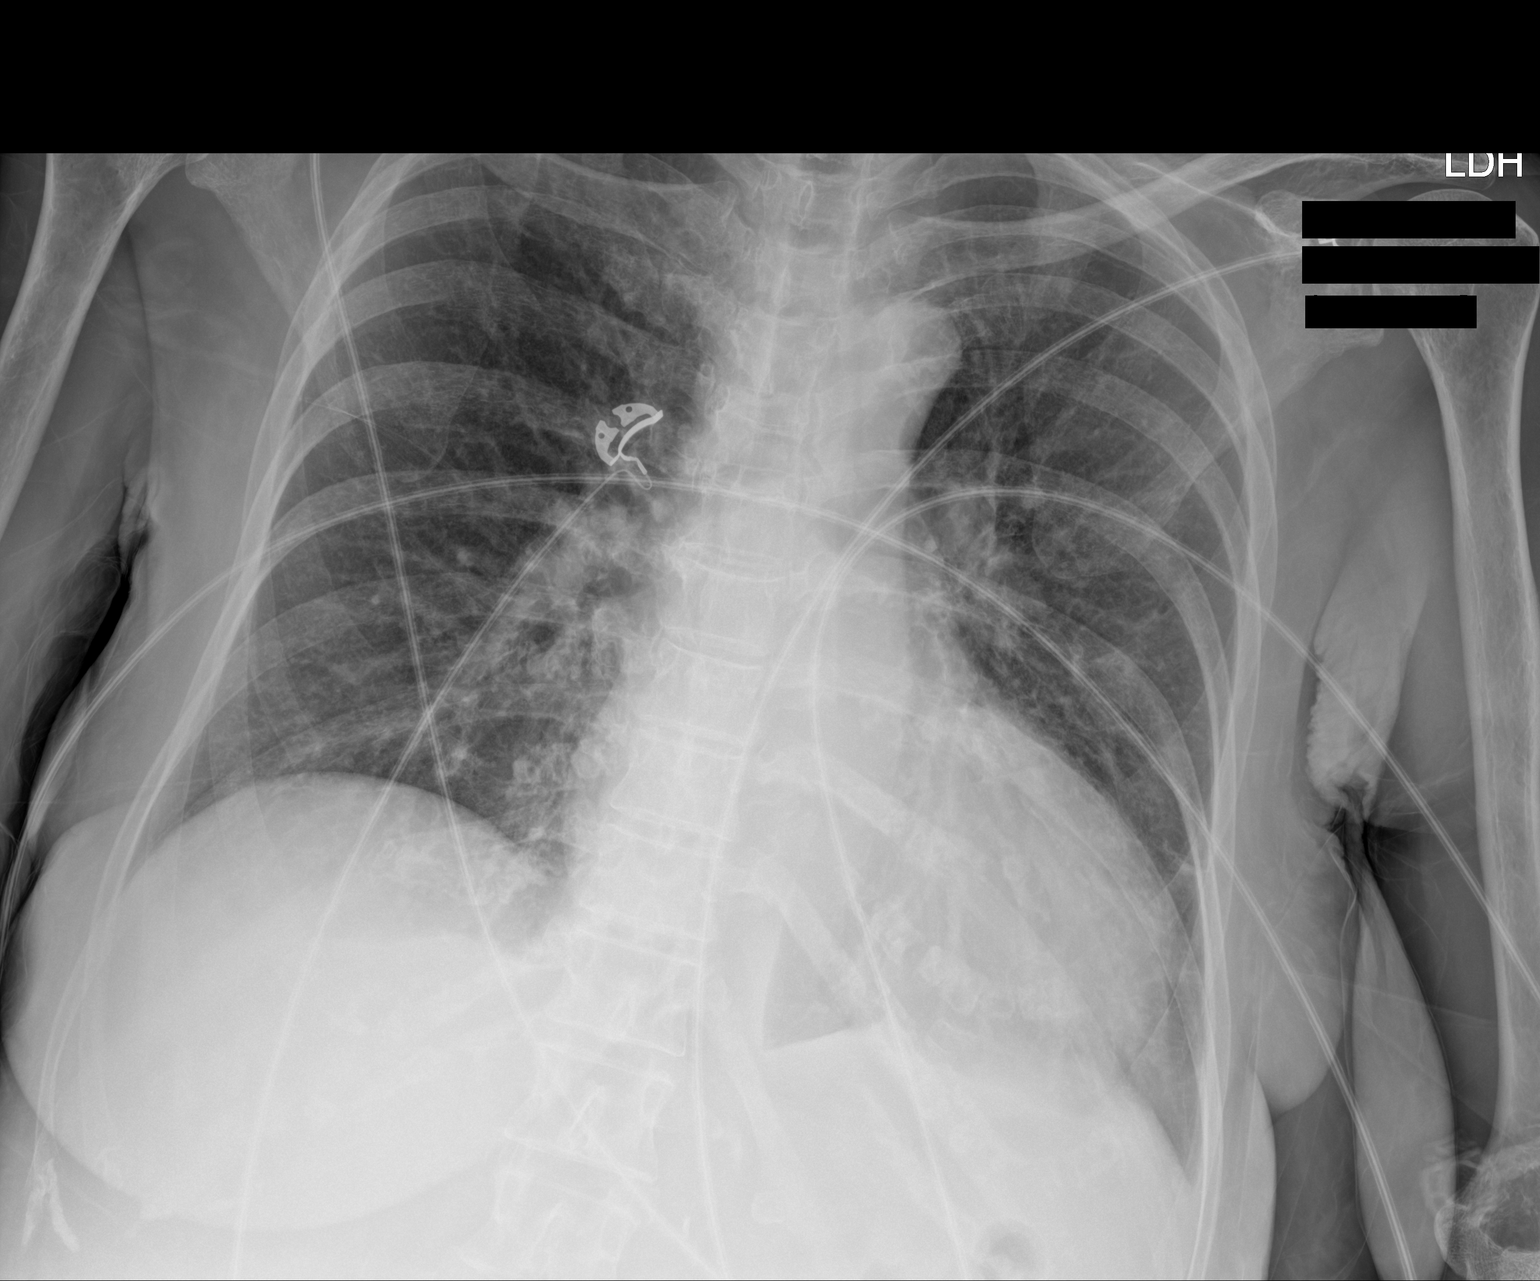

[1 of 1 positions shown; findings below may reference images not displayed]

FINDINGS: The heart is enlarged. There is no edema or effusion. The visualized
soft tissues and bony thorax are unremarkable.
IMPRESSION: Cardiomegaly without failure.

## 2020-03-07 IMAGING — US VENOUS DOPPLER ULTRASOUND OF LEFT LOWER EXTREMITY
1 series · 13 of 24 positions shown · non-contrast
Comparison: Chest CT earlier today demonstrates no evidence of
pulmonary emboli.

CLINICAL DATA: LEFT leg swelling.



[Series 1: venous doppler ultrasound of left lower extremity · 13 of 33 slices shown]
[im 1/33]
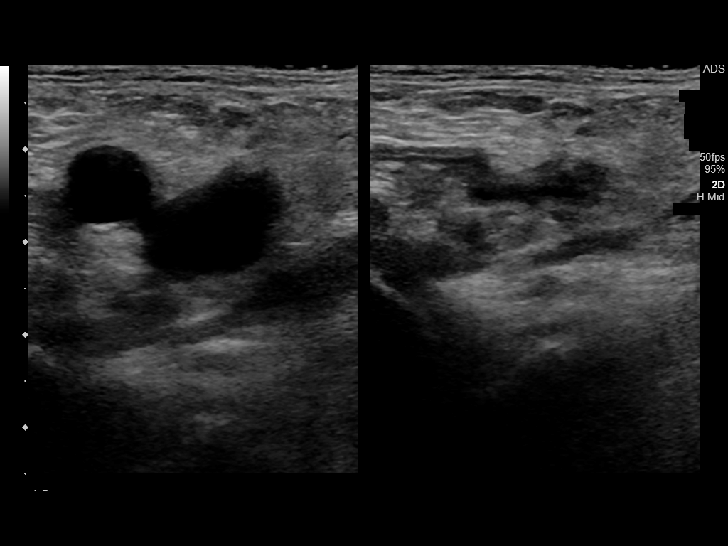
[im 3/33]
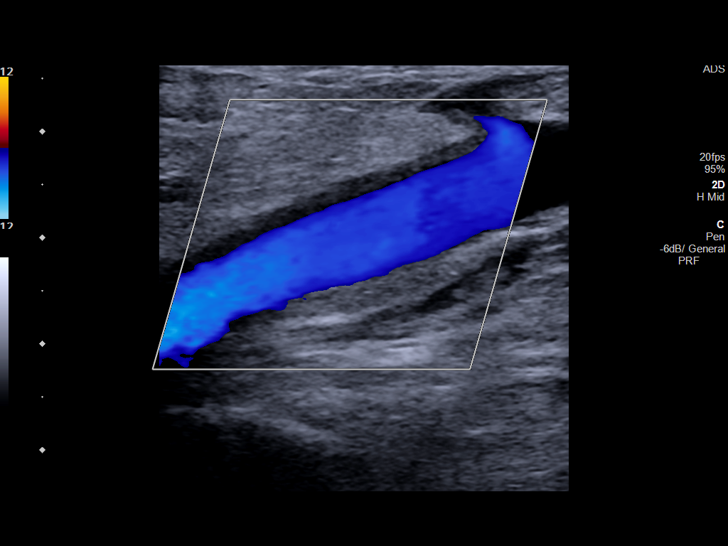
[im 6/33]
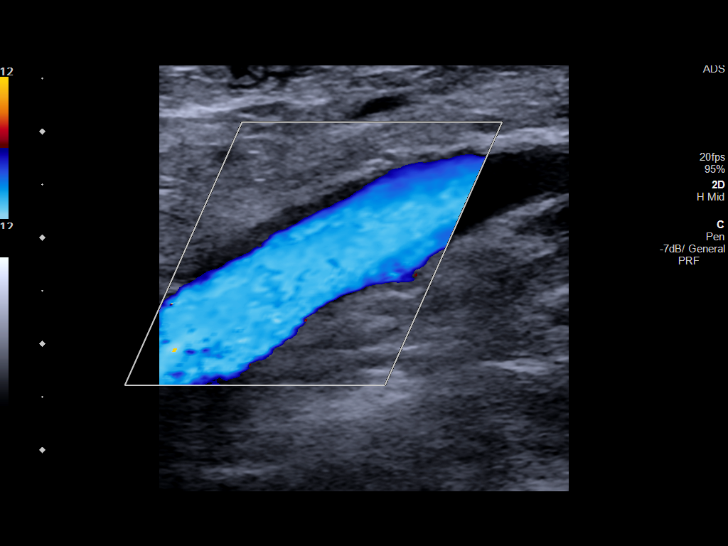
[im 9/33]
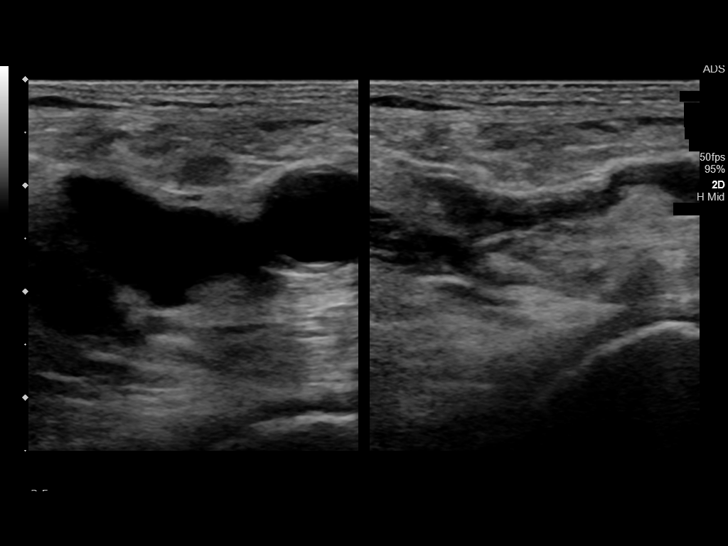
[im 12/33]
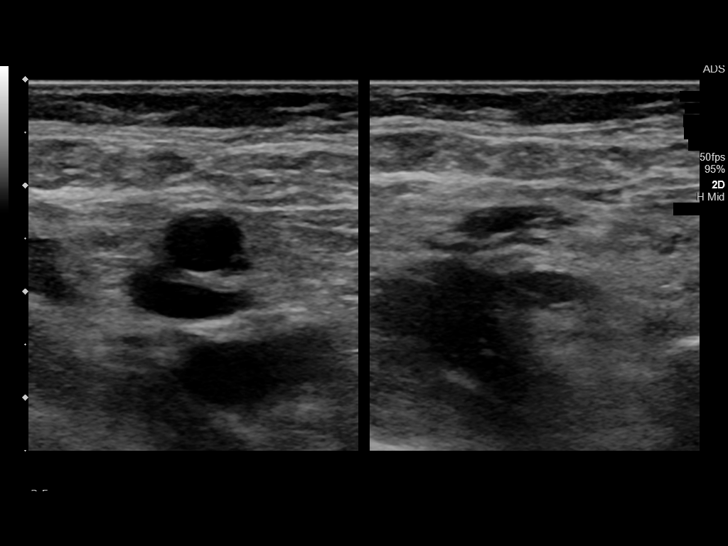
[im 14/33]
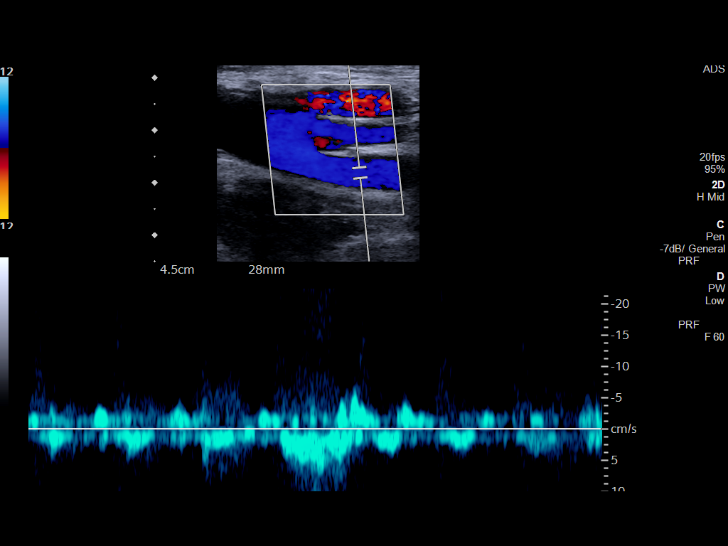
[im 17/33]
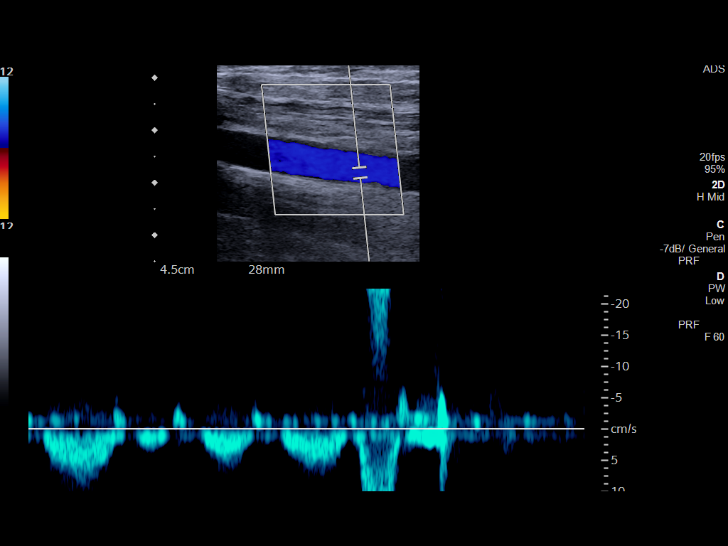
[im 19/33]
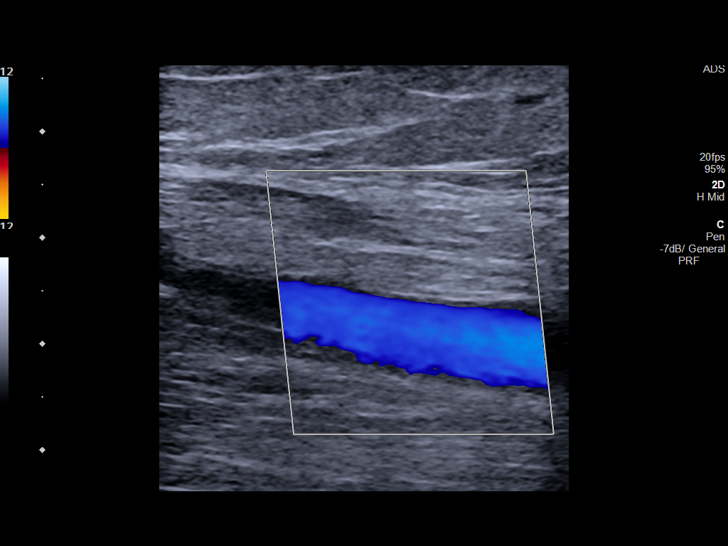
[im 21/33]
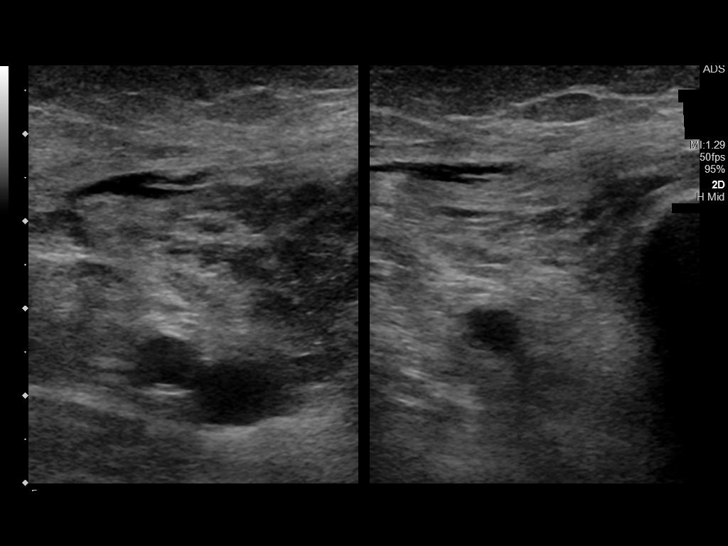
[im 24/33]
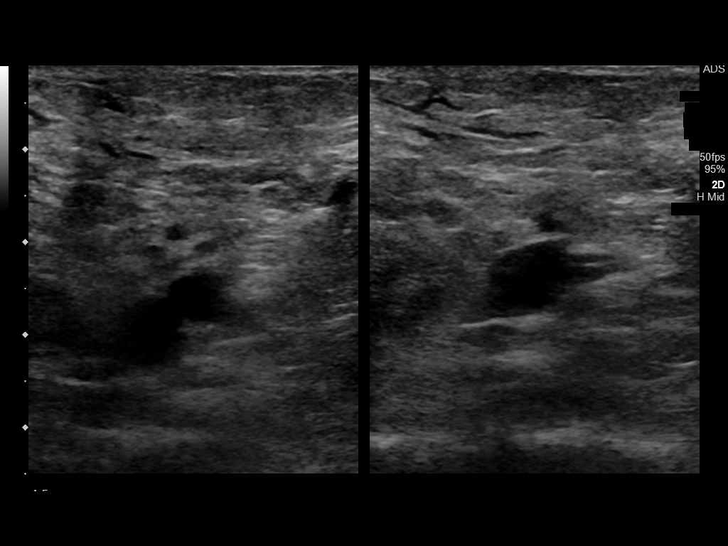
[im 27/33]
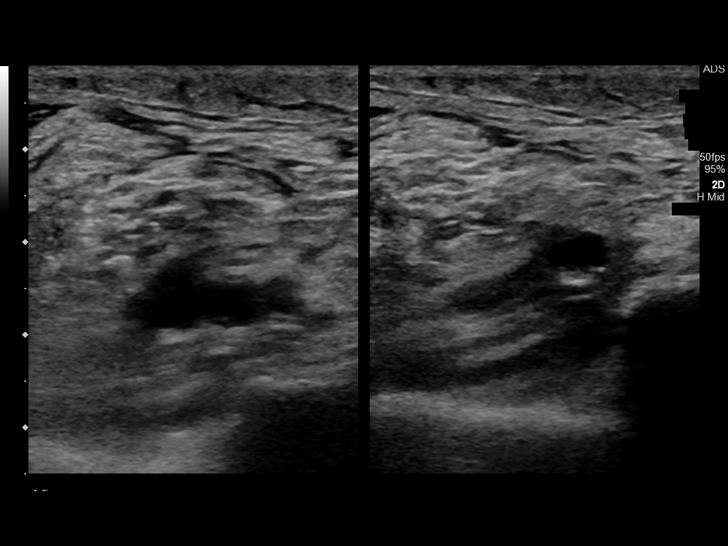
[im 30/33]
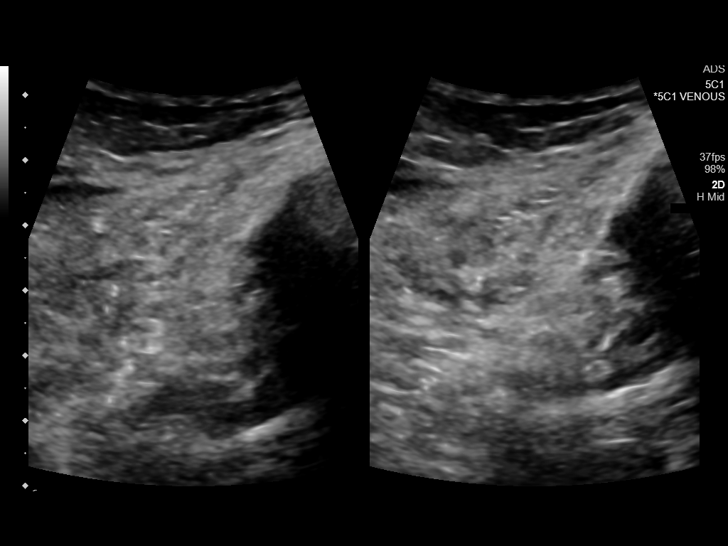
[im 33/33]
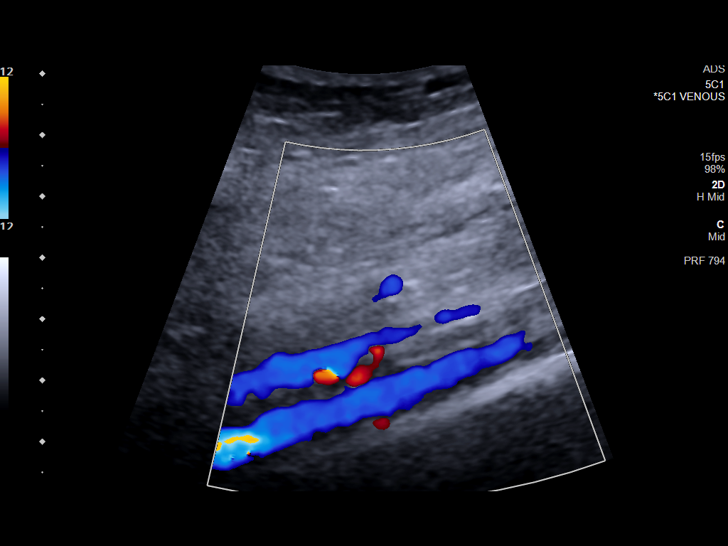

[13 of 24 positions shown; findings below may reference images not displayed]

FINDINGS: Contralateral Common Femoral Vein: Respiratory phasicity is normal
and symmetric with the symptomatic side. No evidence of thrombus.
Normal compressibility.

Common Femoral Vein: No evidence of thrombus. Normal
compressibility, respiratory phasicity and response to augmentation.

Saphenofemoral Junction: No evidence of thrombus. Normal
compressibility and flow on color Doppler imaging.

Profunda Femoral Vein: No evidence of thrombus. Normal
compressibility and flow on color Doppler imaging.

Femoral Vein: No evidence of thrombus. Normal compressibility,
respiratory phasicity and response to augmentation.

Popliteal Vein: No evidence of thrombus. Normal compressibility,
respiratory phasicity and response to augmentation.

Calf Veins: Poorly visualized calf veins.

Superficial Great Saphenous Vein: No evidence of thrombus. Normal
compressibility.

Venous Reflux:  None.

Other Findings:  None.
IMPRESSION: No evidence of LEFT lower extremity deep venous thrombosis.

## 2020-03-07 IMAGING — CT CT ANGIOGRAPHY CHEST
2 of 6 series · 19 of 46 positions shown · IV contrast (APPLIED)
Comparison: Chest radiograph earlier today.

CLINICAL DATA: Generalized weakness. LEFT lower extremity edema.

EXAM:
CT ANGIOGRAPHY CHEST WITH CONTRAST
TECHNIQUE: Multidetector CT imaging of the chest was performed using the
standard protocol during bolus administration of intravenous
contrast. Multiplanar CT image reconstructions and MIPs were
obtained to evaluate the vascular anatomy.
CONTRAST:  60mL OMNIPAQUE IOHEXOL 350 MG/ML SOLN

[Series 6: thins · axial · 0.63mm/px · z∈[+1170,+1418]mm · 16 of 272 slices shown]
[im 12/272  lung]
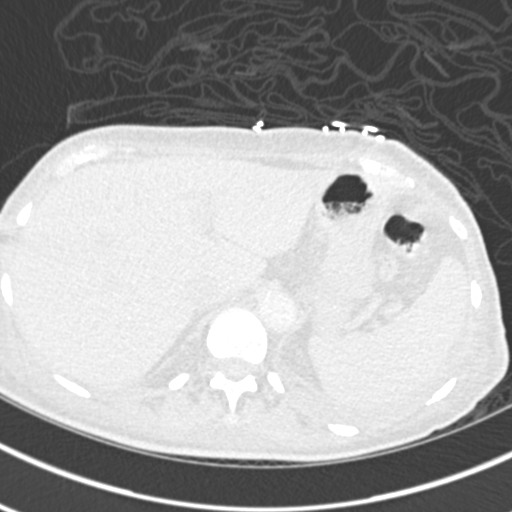
[im 36/272  soft-tissue]
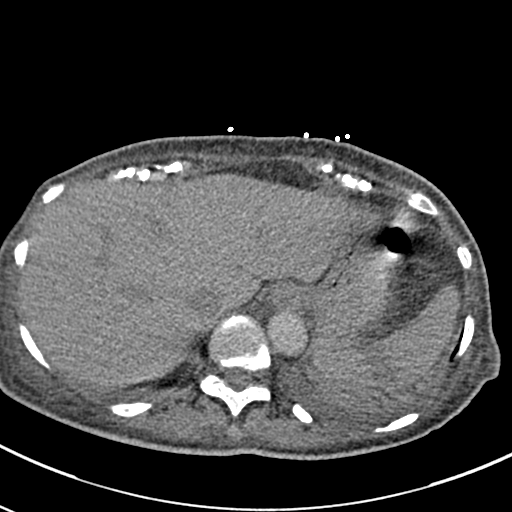
[im 48/272  lung]
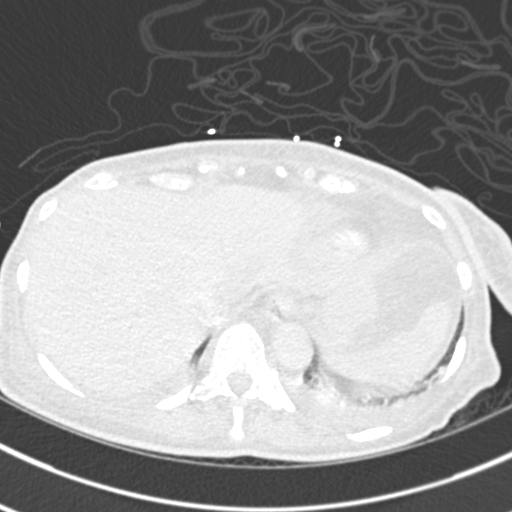
[im 59/272  soft-tissue]
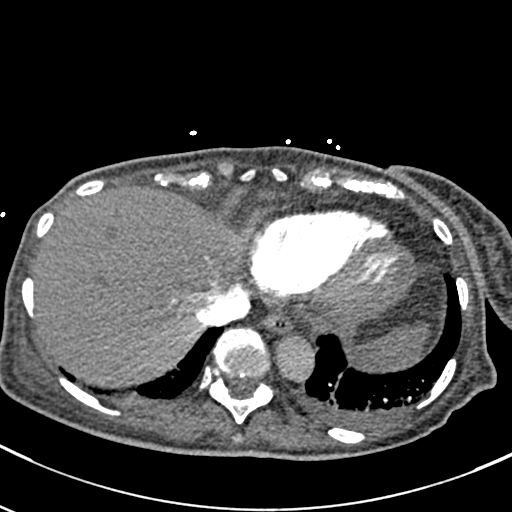
[im 83/272  lung]
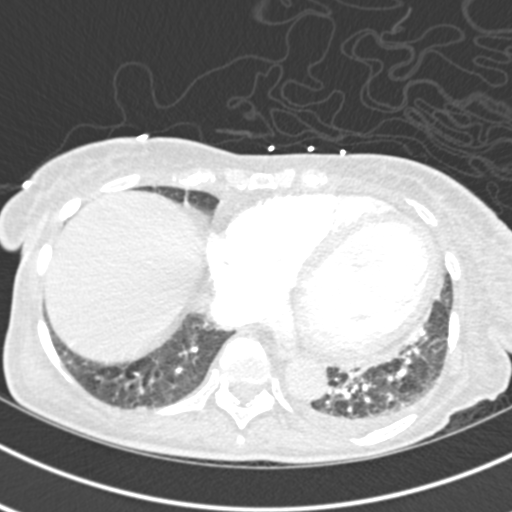
[im 95/272  soft-tissue]
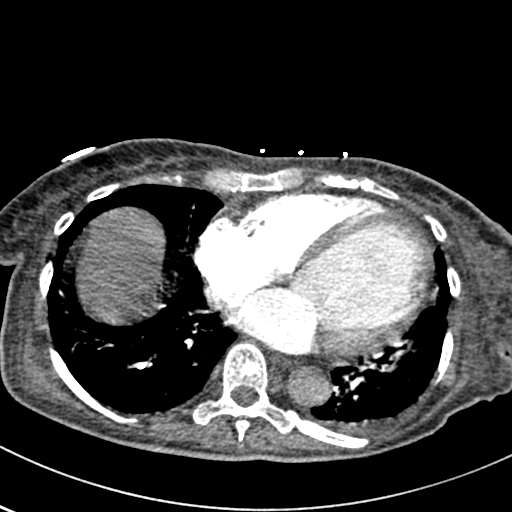
[im 107/272  lung]
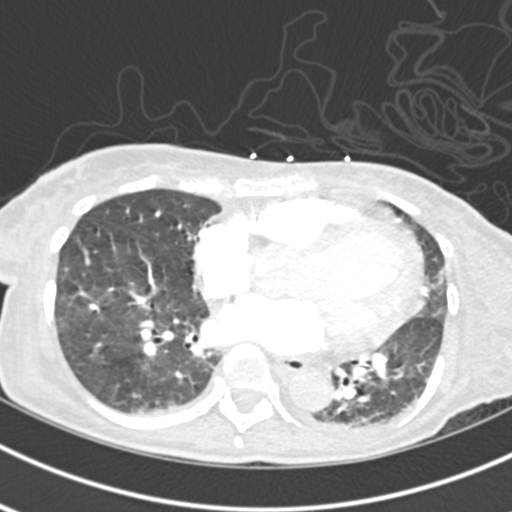
[im 130/272  soft-tissue]
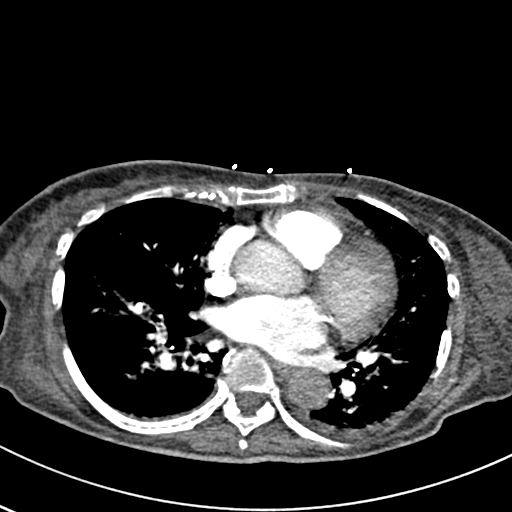
[im 142/272  lung]
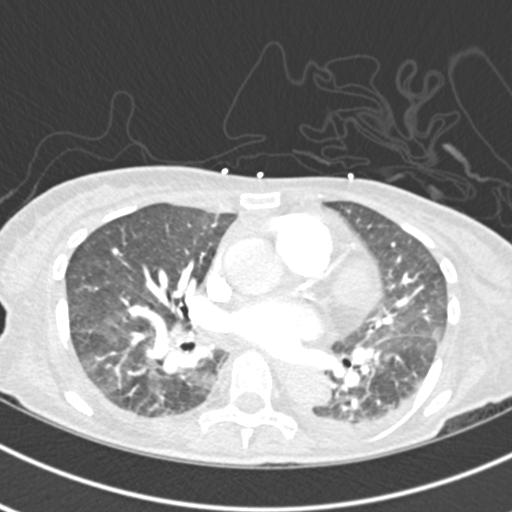
[im 165/272  soft-tissue]
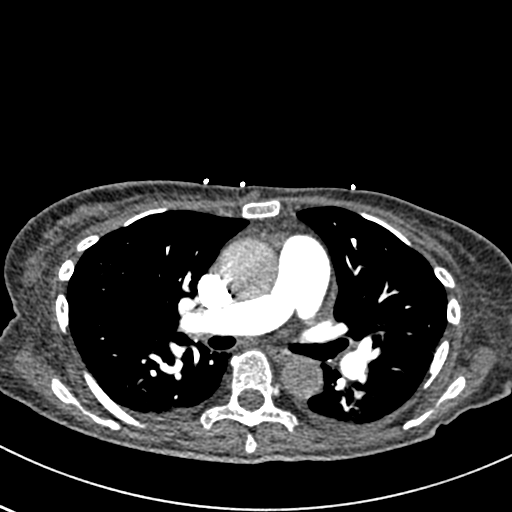
[im 177/272  lung]
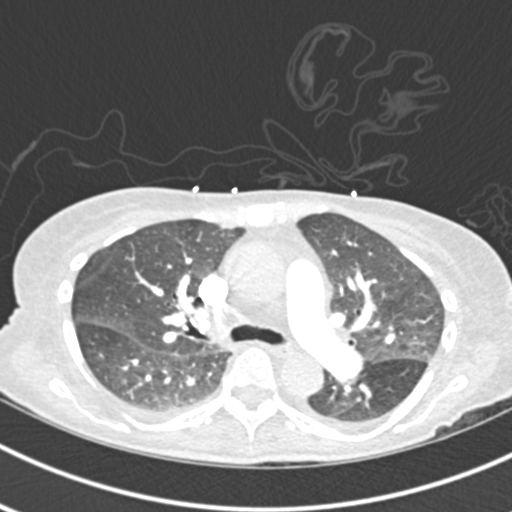
[im 189/272  soft-tissue]
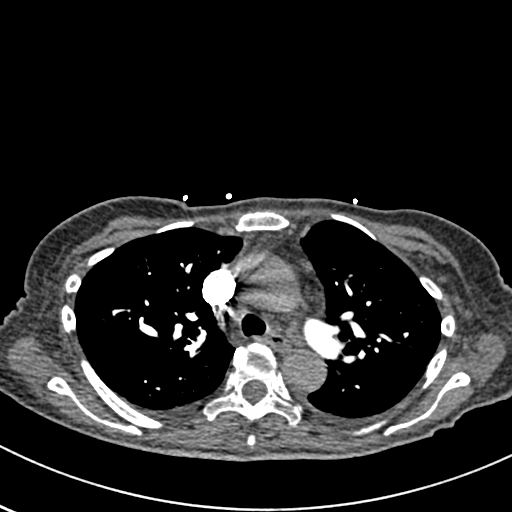
[im 213/272  lung]
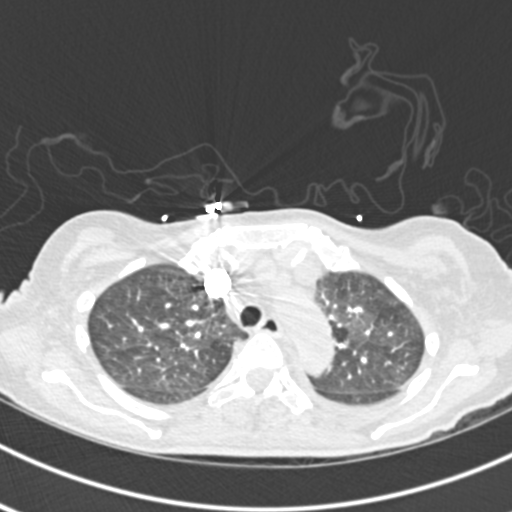
[im 224/272  soft-tissue]
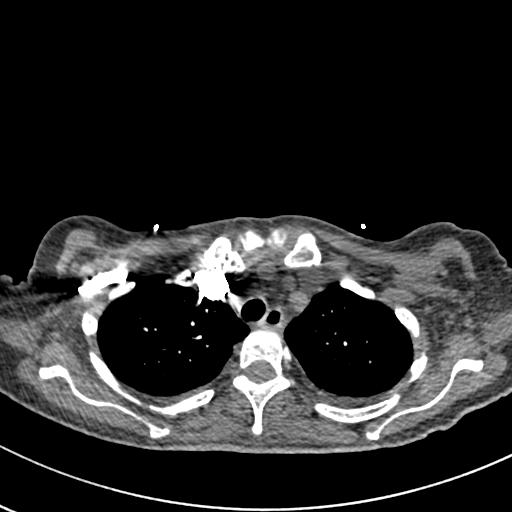
[im 236/272  lung]
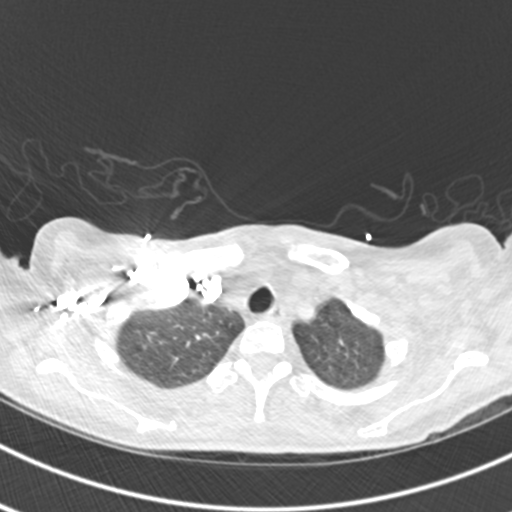
[im 260/272  soft-tissue]
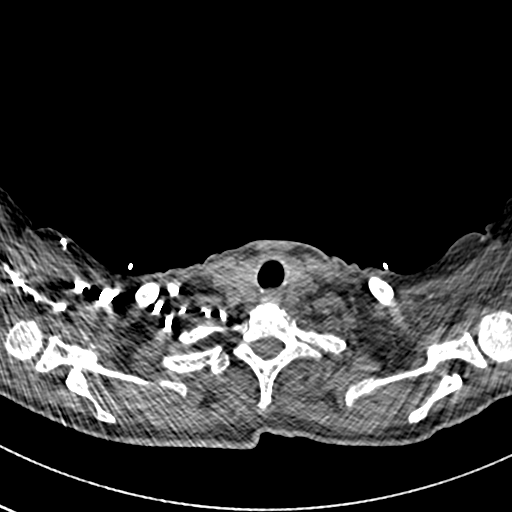

[Series 8: coronal mpr · coronal · 0.55mm/px · 3 of 67 slices shown]
[im 17/67  soft-tissue]
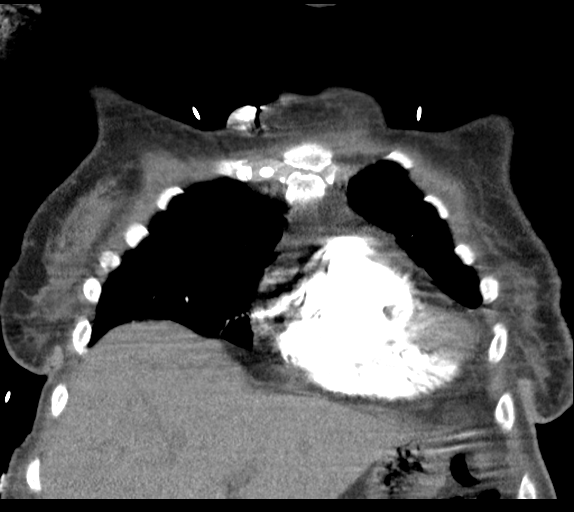
[im 34/67  soft-tissue]
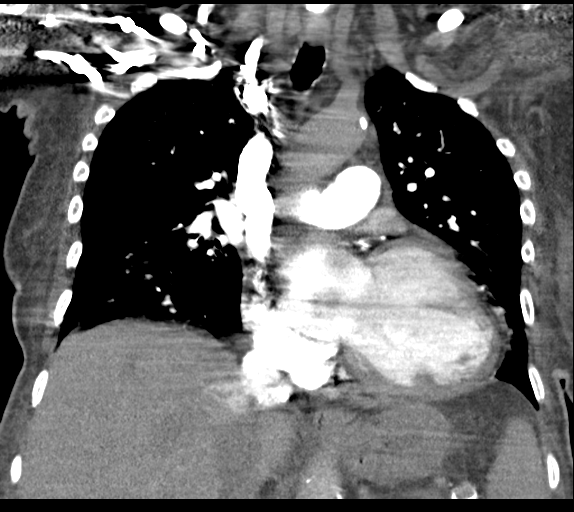
[im 50/67  soft-tissue]
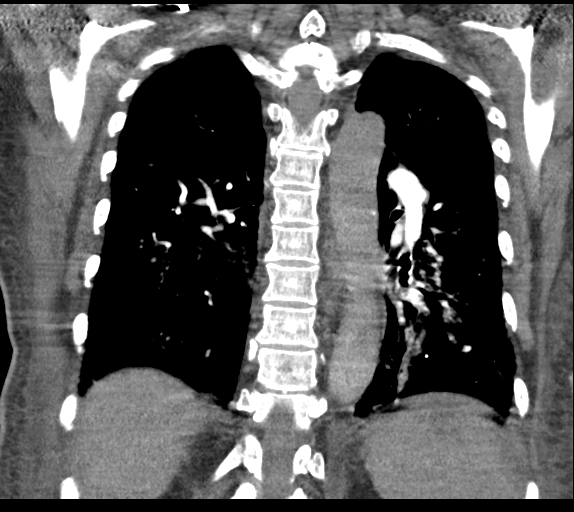

[19 of 46 positions shown; findings below may reference images not displayed]

FINDINGS: Considerable respiratory motion decreases sensitivity and
specificity. Overall study is grossly diagnostic.

Cardiovascular: Satisfactory opacification of the pulmonary arteries
to the segmental level. No evidence of pulmonary embolism.
Cardiomegaly. No pericardial effusion. Aortic atherosclerosis.

Mediastinum/Nodes: No enlarged mediastinal, hilar, or axillary lymph
nodes. Thyroid gland, trachea, and esophagus demonstrate no
significant findings.

Lungs/Pleura: Mild dependent edema, and small BILATERAL effusions,
greater on the LEFT. Mild LEFT basilar atelectasis.

Upper Abdomen: No acute abnormality.

Musculoskeletal: Unremarkable.

Review of the MIP images confirms the above findings.
IMPRESSION: 1. No evidence for pulmonary emboli.
2. Mild dependent edema, and small BILATERAL effusions, greater on
the LEFT.

Aortic Atherosclerosis ([id number]).
